# Patient Record
Sex: Female | Born: 1995 | Race: White | Hispanic: No | Marital: Single | Smoking: Never smoker
Health system: Southern US, Community
[De-identification: ages and names within clinical notes are randomized; demographics above are authoritative.]

## PROBLEM LIST (undated history)

## (undated) ENCOUNTER — Ambulatory Visit: Discharge status: Absent without leave | Payer: Self-pay

## (undated) DIAGNOSIS — K219 Gastro-esophageal reflux disease without esophagitis: Secondary | ICD-10-CM

## (undated) HISTORY — PX: MENISCUS REPAIR: SHX5179

---

## 2015-08-22 ENCOUNTER — Other Ambulatory Visit: Payer: Self-pay | Admitting: Family Medicine

## 2015-08-22 DIAGNOSIS — S79921S Unspecified injury of right thigh, sequela: Secondary | ICD-10-CM

## 2015-08-26 ENCOUNTER — Ambulatory Visit: Payer: Self-pay

## 2015-08-28 ENCOUNTER — Ambulatory Visit
Admission: RE | Admit: 2015-08-28 | Discharge: 2015-08-28 | Disposition: A | Discharge status: Home / Self Care | Payer: 59 | Source: Ambulatory Visit | Attending: Family Medicine | Admitting: Family Medicine

## 2015-08-28 DIAGNOSIS — S79921A Unspecified injury of right thigh, initial encounter: Secondary | ICD-10-CM | POA: Diagnosis present

## 2015-08-28 DIAGNOSIS — S79921S Unspecified injury of right thigh, sequela: Secondary | ICD-10-CM

## 2015-08-28 DIAGNOSIS — R531 Weakness: Secondary | ICD-10-CM | POA: Insufficient documentation

## 2015-08-28 DIAGNOSIS — M79652 Pain in left thigh: Secondary | ICD-10-CM | POA: Diagnosis not present

## 2015-11-17 DIAGNOSIS — J069 Acute upper respiratory infection, unspecified: Secondary | ICD-10-CM | POA: Diagnosis not present

## 2015-12-23 ENCOUNTER — Ambulatory Visit
Admission: RE | Admit: 2015-12-23 | Discharge: 2015-12-23 | Disposition: A | Discharge status: Home / Self Care | Payer: 59 | Source: Ambulatory Visit | Attending: Family Medicine | Admitting: Family Medicine

## 2015-12-23 ENCOUNTER — Encounter: Payer: Self-pay | Admitting: Family Medicine

## 2015-12-23 ENCOUNTER — Other Ambulatory Visit: Payer: Self-pay | Admitting: Family Medicine

## 2015-12-23 ENCOUNTER — Ambulatory Visit (INDEPENDENT_AMBULATORY_CARE_PROVIDER_SITE_OTHER): Payer: 59 | Admitting: Family Medicine

## 2015-12-23 DIAGNOSIS — M25462 Effusion, left knee: Secondary | ICD-10-CM | POA: Diagnosis not present

## 2015-12-23 DIAGNOSIS — S8992XA Unspecified injury of left lower leg, initial encounter: Secondary | ICD-10-CM

## 2015-12-23 DIAGNOSIS — M25562 Pain in left knee: Secondary | ICD-10-CM | POA: Diagnosis present

## 2015-12-23 DIAGNOSIS — R52 Pain, unspecified: Secondary | ICD-10-CM

## 2015-12-23 MED ORDER — NAPROXEN 500 MG PO TABS
500.0000 mg | ORAL_TABLET | Freq: Two times a day (BID) | ORAL | Status: DC
Start: 2015-12-23 — End: 2018-06-16

## 2015-12-23 NOTE — Progress Notes (Addendum)
Patient ID: Mariah Trevino, female   DOB: 08/21/1996, 20 y.o.   MRN: [id number]  Patient presents today with symptoms of left knee pain. Patient states that she twisted her knee yesterday out in the rain playing soccer. She denies any contact to the knee. She admits to some swelling of the knee and medial knee pain. She does have pain with extension of the knee and flexion. She does have a history of anterior cruciate ligament reconstruction of the left knee and also meniscal repair of that knee.  ROS: Negative except mentioned above.  Vitals as per Epic.  GENERAL: NAD RESP: CTA B CARD: RRR MSK: L Knee- mild effusion, unable to fully extend without discomfort, medial jointline tenderness, -Lachman, -Drawer, +McMurray, no significant laxity with valgus or varus stress, nv intact  NEURO: CN II-XII grossly intact   A/P: L Knee Injury- will send patient for x-rays, will order MRI to evaluate for any meniscal or ligamentous injury. Patient is to do GameReady and NSAIDs. Can stay in knee brace. Will discuss further plan of care once imaging has been done.

## 2015-12-23 NOTE — Addendum Note (Signed)
Addended by: Dione HousekeeperPATEL, Brahim Dolman N on: 12/23/2015 12:08 PM   Modules accepted: Orders

## 2015-12-25 ENCOUNTER — Ambulatory Visit
Admission: RE | Admit: 2015-12-25 | Discharge: 2015-12-25 | Disposition: A | Discharge status: Home / Self Care | Payer: 59 | Source: Ambulatory Visit | Attending: Family Medicine | Admitting: Family Medicine

## 2015-12-25 DIAGNOSIS — M25462 Effusion, left knee: Secondary | ICD-10-CM | POA: Diagnosis not present

## 2015-12-25 DIAGNOSIS — Z9889 Other specified postprocedural states: Secondary | ICD-10-CM | POA: Insufficient documentation

## 2015-12-25 DIAGNOSIS — S83212A Bucket-handle tear of medial meniscus, current injury, left knee, initial encounter: Secondary | ICD-10-CM | POA: Diagnosis not present

## 2015-12-25 DIAGNOSIS — S8992XA Unspecified injury of left lower leg, initial encounter: Secondary | ICD-10-CM

## 2015-12-25 DIAGNOSIS — S83282A Other tear of lateral meniscus, current injury, left knee, initial encounter: Secondary | ICD-10-CM | POA: Diagnosis not present

## 2016-02-13 ENCOUNTER — Ambulatory Visit (INDEPENDENT_AMBULATORY_CARE_PROVIDER_SITE_OTHER): Payer: 59 | Admitting: Family Medicine

## 2016-02-13 ENCOUNTER — Encounter: Payer: Self-pay | Admitting: Family Medicine

## 2016-02-13 VITALS — BP 135/66 | HR 69 | Temp 98.3°F | Resp 16

## 2016-02-13 DIAGNOSIS — H109 Unspecified conjunctivitis: Secondary | ICD-10-CM | POA: Diagnosis not present

## 2016-02-13 MED ORDER — POLYMYXIN B-TRIMETHOPRIM 10000-0.1 UNIT/ML-% OP SOLN: 1.0000 [drp] | Freq: Four times a day (QID) | OPHTHALMIC | Status: AC

## 2016-02-13 NOTE — Progress Notes (Signed)
Patient ID: Mariah Trevino, female   DOB: 10/02/1996, 20 y.o.   MRN: [id number] Patient presents today with symptoms of mattiness and redness of the left eye. Patient states that she noticed this earlier this morning. She denies any URI symptoms she denies wearing contact lenses or eye makeup. She denies fever. She denies any vision difficulty.  ROS: Negative except mentioned above.  GENERAL: NAD HEENT: no pharyngeal erythema, no exudate, no erythema of TMs, mild erythema of the left eye, no discharge noted, PERRL, EOMI, no cervical LAD RESP: CTA B CARD: RRR NEURO: CN II-XII grossly intact   A/P: Left Conjunctivitis- will treat with Polytrim ophthalmic, oral antihistamine when necessary, wash hands frequently, no weight room activities for the next 2 days at least, if symptoms do persist or worsen I do recommend that she follow up.

## 2016-07-11 HISTORY — PX: ANTERIOR CRUCIATE LIGAMENT REPAIR: SHX115

## 2016-09-20 ENCOUNTER — Ambulatory Visit (INDEPENDENT_AMBULATORY_CARE_PROVIDER_SITE_OTHER): Payer: 59 | Admitting: Family Medicine

## 2016-09-20 ENCOUNTER — Encounter: Payer: Self-pay | Admitting: Family Medicine

## 2016-09-20 VITALS — BP 130/77 | HR 89 | Temp 97.9°F | Resp 16

## 2016-09-20 DIAGNOSIS — J209 Acute bronchitis, unspecified: Secondary | ICD-10-CM

## 2016-09-20 MED ORDER — ALBUTEROL SULFATE HFA 108 (90 BASE) MCG/ACT IN AERS: 2.0000 | INHALATION_SPRAY | Freq: Four times a day (QID) | RESPIRATORY_TRACT | 2 refills | Status: DC | PRN

## 2016-09-20 MED ORDER — AZITHROMYCIN 250 MG PO TABS: ORAL_TABLET | ORAL | 0 refills | Status: DC

## 2016-09-20 NOTE — Progress Notes (Signed)
Patient presents today with symptoms of postnasal drip, mild productive cough. Patient has had symptoms for the last 5 days. She denies any fever or chills. She does become short of breath with the coughing at times. Mucus has been colored at times. She denies any calf pain or swelling. She has been taking Tylenol for her symptoms. She denies any history of asthma.  ROS: Negative except mentioned above. Vitals as per Epic.  GENERAL: NAD HEENT: no pharyngeal erythema, no exudate, no erythema of TMs, no cervical LAD RESP: Slightly decreased breath sounds at the bases, no wheezing appreciated no accessory muscle use noted CARD: RRR NEURO: CN II-XII grossly intact   A/P: Bronchitis -will treat with Z-Pak, Claritin when necessary, Delsym when necessary, Albuterol Inhaler with spacer when necessary, rest, hydration, no athletic activity if febrile or unable to due to respiratory symptoms. We'll seek medical attention if symptoms persist or worsen as discussed.

## 2017-05-30 ENCOUNTER — Ambulatory Visit (INDEPENDENT_AMBULATORY_CARE_PROVIDER_SITE_OTHER): Payer: PRIVATE HEALTH INSURANCE | Admitting: Family Medicine

## 2017-05-30 DIAGNOSIS — S8012XA Contusion of left lower leg, initial encounter: Secondary | ICD-10-CM

## 2017-05-31 NOTE — Progress Notes (Signed)
Patient presents today with hematoma to left lower leg area. Patient states that the area got hit during a soccer over a week ago. She has been applying ice to the area. She is able to walk and run without any pain. She states that it only hurts to touch. The swelling has decreased some since the injury. There is some decreased sensation over the hematoma but not distally. She denies any other injury.  ROS: Negative except mentioned above. Vitals as per Epic. GENERAL: NAD MSK: Left lower extremity - approx. 4 in x 3 in hematoma anterior shin area, mild to moderate tenderness, FROM of LLE, nv intact distally  NEURO: CN II-XII grossly intact   A/P: LLE Hematoma - ice, elevation, trainer to gently massage the area towards the heart, discussed with patient that it will take time to resorb, protect the area as much as possible to prevent recurrent injury, patient could be left with a small bump/hematoma. Seek medical attention if symptoms worsen as discussed.

## 2017-08-16 ENCOUNTER — Other Ambulatory Visit: Payer: Self-pay | Admitting: Family Medicine

## 2017-08-16 ENCOUNTER — Ambulatory Visit (INDEPENDENT_AMBULATORY_CARE_PROVIDER_SITE_OTHER): Payer: 59 | Admitting: Family Medicine

## 2017-08-16 ENCOUNTER — Ambulatory Visit
Admission: RE | Admit: 2017-08-16 | Discharge: 2017-08-16 | Disposition: A | Discharge status: Home / Self Care | Payer: PRIVATE HEALTH INSURANCE | Source: Ambulatory Visit | Attending: Family Medicine | Admitting: Family Medicine

## 2017-08-16 ENCOUNTER — Encounter: Payer: Self-pay | Admitting: Family Medicine

## 2017-08-16 VITALS — BP 124/71 | HR 83 | Temp 98.6°F | Resp 14

## 2017-08-16 DIAGNOSIS — G4459 Other complicated headache syndrome: Secondary | ICD-10-CM | POA: Diagnosis not present

## 2017-08-16 DIAGNOSIS — G935 Compression of brain: Secondary | ICD-10-CM | POA: Insufficient documentation

## 2017-08-16 DIAGNOSIS — G4489 Other headache syndrome: Secondary | ICD-10-CM

## 2017-08-17 LAB — COMPREHENSIVE METABOLIC PANEL WITH GFR
ALT: 17 IU/L (ref 0–32)
AST: 18 IU/L (ref 0–40)
Albumin/Globulin Ratio: 1.7 (ref 1.2–2.2)
Albumin: 4.6 g/dL (ref 3.5–5.5)
Alkaline Phosphatase: 102 IU/L (ref 39–117)
BUN/Creatinine Ratio: 16 (ref 9–23)
BUN: 13 mg/dL (ref 6–20)
Bilirubin Total: 0.5 mg/dL (ref 0.0–1.2)
CO2: 24 mmol/L (ref 20–29)
Calcium: 9.6 mg/dL (ref 8.7–10.2)
Chloride: 103 mmol/L (ref 96–106)
Creatinine, Ser: 0.81 mg/dL (ref 0.57–1.00)
GFR calc Af Amer: 120 mL/min/1.73 (ref >59)
GFR calc non Af Amer: 104 mL/min/1.73 (ref >59)
Globulin, Total: 2.7 g/dL (ref 1.5–4.5)
Glucose: 105 mg/dL — ABNORMAL HIGH (ref 65–99)
Potassium: 4.3 mmol/L (ref 3.5–5.2)
Sodium: 142 mmol/L (ref 134–144)
Total Protein: 7.3 g/dL (ref 6.0–8.5)

## 2017-08-17 LAB — CBC WITH DIFFERENTIAL/PLATELET
BASOS ABS: 0 10*3/uL (ref 0.0–0.2)
BASOS: 0 %
EOS (ABSOLUTE): 0 10*3/uL (ref 0.0–0.4)
Eos: 0 %
Hematocrit: 41.6 % (ref 34.0–46.6)
Hemoglobin: 14 g/dL (ref 11.1–15.9)
IMMATURE GRANS (ABS): 0 10*3/uL (ref 0.0–0.1)
IMMATURE GRANULOCYTES: 0 %
LYMPHS: 14 %
Lymphocytes Absolute: 1.2 10*3/uL (ref 0.7–3.1)
MCH: 29.3 pg (ref 26.6–33.0)
MCHC: 33.7 g/dL (ref 31.5–35.7)
MCV: 87 fL (ref 79–97)
MONOS ABS: 0.3 10*3/uL (ref 0.1–0.9)
Monocytes: 4 %
Neutrophils Absolute: 7.1 10*3/uL — ABNORMAL HIGH (ref 1.4–7.0)
Neutrophils: 82 %
PLATELETS: 286 10*3/uL (ref 150–379)
RBC: 4.78 x10E6/uL (ref 3.77–5.28)
RDW: 12.4 % (ref 12.3–15.4)
WBC: 8.6 10*3/uL (ref 3.4–10.8)

## 2017-08-17 LAB — TSH: TSH: 1.11 u[IU]/mL (ref 0.450–4.500)

## 2017-08-17 LAB — VITAMIN D 25 HYDROXY (VIT D DEFICIENCY, FRACTURES): Vit D, 25-Hydroxy: 47.9 ng/mL (ref 30.0–100.0)

## 2017-08-18 ENCOUNTER — Ambulatory Visit (INDEPENDENT_AMBULATORY_CARE_PROVIDER_SITE_OTHER): Payer: 59 | Admitting: Family Medicine

## 2017-08-18 ENCOUNTER — Encounter: Payer: Self-pay | Admitting: Family Medicine

## 2017-08-18 VITALS — BP 124/76 | HR 56 | Temp 98.4°F | Resp 14

## 2017-08-18 DIAGNOSIS — G4459 Other complicated headache syndrome: Secondary | ICD-10-CM

## 2017-08-18 NOTE — Progress Notes (Signed)
Patient presents today for follow-up regarding recent CT scan. 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 Discussed the process of this condition. She has had no previous CTs or MRIs in the past. Patient has had no changes in her symptoms and she saw me a few days ago. I have spoken to Dr. Cristopher PeruHemang Shah who will be seeing the patient tomorrow. I've asked the patient to write down the course of events and her symptoms to share with Dr. Sherryll BurgerShah. Told the patient I would be happy to speak with her parents or Dr. Sherryll BurgerShah can also. Woodward KuWeill continue at this point to hold out of athletic activity. She has no questions at this time. If any worsening symptoms she is to seek medical attention immediately.

## 2017-08-18 NOTE — Progress Notes (Signed)
Patient presents today with episodes of waking up in the middle the night and morning headaches. Patient states that she has had the symptoms for the last 2-3 weeks. The nighttime awakenings happen 2-3 times a week and so did the morning headaches. She she states that after she wakes up in the middle the night it takes her anywhere from 10 minutes to an hour to fall back asleep. She denies any history of generalized anxiety or panic attacks. Patient had a concussion after a blow to the back of the head playing soccer in September. She returned back to soccer after being asymptomatic and following the concussion protocol. She denies any recent reinjury to her head. She states that 30 minutes after practice today she had a few minutes of blurry vision. She states that she has never had symptoms like that before. She has minimal left neck discomfort which she has had for a few weeks. She admits to only minimal dizziness at times. She denies having any balance problems. She denies any syncopal episodes. She has intermittent headaches throughout the day. She rates the pain level of these headaches a 2 out of 10. She has taken Tylenol/Ibuprofen on occasion when she does have the headaches. She denies any past history of migraines. She denies any new medications or supplements. She denies any chronic medical problems such as sleep apnea or diabetes. She admits that her menstrual cycles are regular.  ROS: Negative except mentioned above. Vitals as per Epic. GENERAL: NAD HEENT: no pharyngeal erythema, no exudate, no erythema of TMs, no cervical LAD, PERRL, EOMI RESP: CTA B CARD: RRR MSK: No midline tenderness of cervical spine, mild paravertebral tenderness on the left compared to right, slightly limited range of motion to the left compared to the right, negative Spurling's NEURO: CN II-XII grossly intact, negative Rombergs, no nystagmus appreciated  A/P: Nighttime awakenings, headaches -given patient's symptoms  will order CT head, patient may need sleep study given her symptoms, I will hold her from athletic activity for now, may need to refer patient to Neurology. Discussed plan of care with patient and trainer. She is to seek immediate medical attention if any of her symptoms worsen.

## 2017-08-23 ENCOUNTER — Encounter: Payer: Self-pay | Admitting: Family Medicine

## 2017-08-23 ENCOUNTER — Ambulatory Visit (INDEPENDENT_AMBULATORY_CARE_PROVIDER_SITE_OTHER): Payer: 59 | Admitting: Family Medicine

## 2017-08-23 VITALS — BP 125/67 | HR 81

## 2017-08-23 DIAGNOSIS — R51 Headache: Secondary | ICD-10-CM

## 2017-08-23 DIAGNOSIS — R519 Headache, unspecified: Secondary | ICD-10-CM

## 2017-08-23 NOTE — Addendum Note (Signed)
Addended by: Dione HousekeeperPATEL, Cherissa Hook N on: 08/23/2017 11:59 AM   Modules accepted: Orders

## 2017-08-23 NOTE — Progress Notes (Signed)
Here for B12 level requested by Dr. Cristopher PeruHemang Shah.

## 2017-08-24 LAB — B12 AND FOLATE PANEL
FOLATE: 17.6 ng/mL (ref >3.0)
Vitamin B-12: 390 pg/mL (ref 232–1245)

## 2017-08-26 ENCOUNTER — Other Ambulatory Visit: Payer: Self-pay | Admitting: Neurology

## 2017-08-26 DIAGNOSIS — Q048 Other specified congenital malformations of brain: Secondary | ICD-10-CM

## 2017-09-02 ENCOUNTER — Ambulatory Visit: Payer: 59

## 2017-09-06 ENCOUNTER — Ambulatory Visit
Admission: RE | Admit: 2017-09-06 | Discharge: 2017-09-06 | Disposition: A | Discharge status: Home / Self Care | Payer: PRIVATE HEALTH INSURANCE | Source: Ambulatory Visit | Attending: Neurology | Admitting: Neurology

## 2017-09-06 DIAGNOSIS — Q048 Other specified congenital malformations of brain: Secondary | ICD-10-CM | POA: Insufficient documentation

## 2017-09-06 DIAGNOSIS — S060X0D Concussion without loss of consciousness, subsequent encounter: Secondary | ICD-10-CM | POA: Diagnosis present

## 2017-09-06 DIAGNOSIS — X58XXXD Exposure to other specified factors, subsequent encounter: Secondary | ICD-10-CM | POA: Insufficient documentation

## 2017-09-06 MED ORDER — GADOBENATE DIMEGLUMINE 529 MG/ML IV SOLN
15.0000 mL | Freq: Once | INTRAVENOUS | Status: AC | PRN
  Administered 2017-09-06: 14 mL via INTRAVENOUS

## 2018-04-21 IMAGING — MR MR HEAD WO/W CM
10 series · 48 of 48 positions shown · IV contrast (multihance)
Comparison: Head CT 08/16/2017

CLINICAL DATA: Concussion for 2 months. Frontotemporal headache
with dizziness.

"Cerebral cortical dysgenesis"
EXAM:
MRI HEAD WITHOUT AND WITH CONTRAST
TECHNIQUE: Multiplanar, multiecho pulse sequences of the brain and surrounding
structures were obtained without and with intravenous contrast.
CONTRAST:  14mL MULTIHANCE GADOBENATE DIMEGLUMINE 529 MG/ML IV SOLN

[Series 2: T1 · sagittal · 5.0mm · 0.45mm/px · 2 of 27 slices shown (1 of 2)]
[im 1/27]
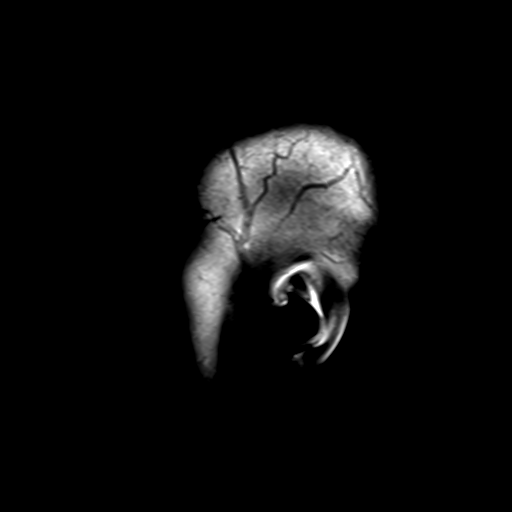
[im 27/27]
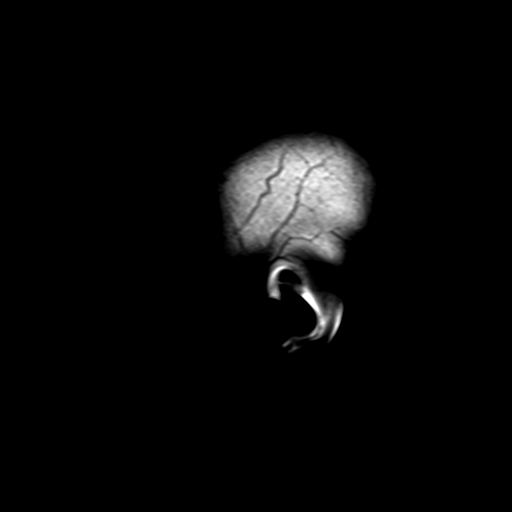

[Series 4: DWI · axial · 3.0mm · 1.80mm/px · z∈[-67,+94]mm · 4 of 55 slices shown]
[im 1/55]
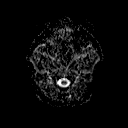
[im 19/55]
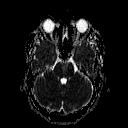
[im 37/55]
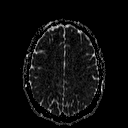
[im 55/55]
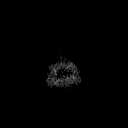

[Series 5: T2 · axial · 5.0mm · 0.60mm/px · z∈[-74,+94]mm · 2 of 27 slices shown (1 of 2)]
[im 1/27]
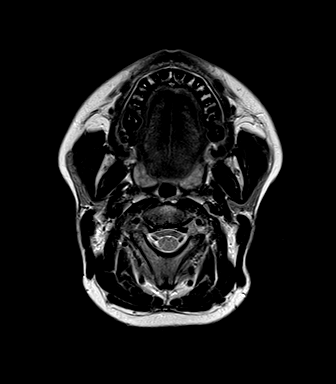
[im 27/27]
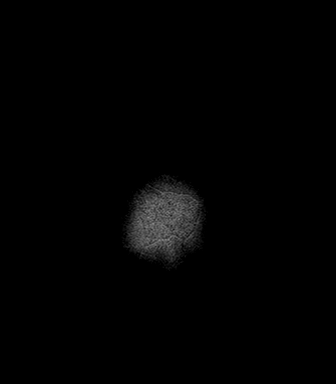

[Series 6: FLAIR · axial · 3.0mm · 0.45mm/px · z∈[-68,+88]mm · 4 of 53 slices shown]
[im 1/53]
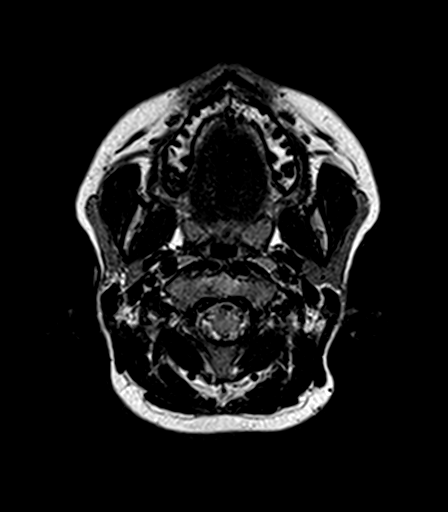
[im 18/53]
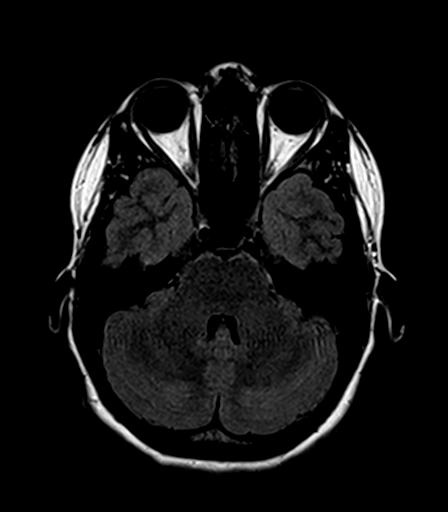
[im 35/53]
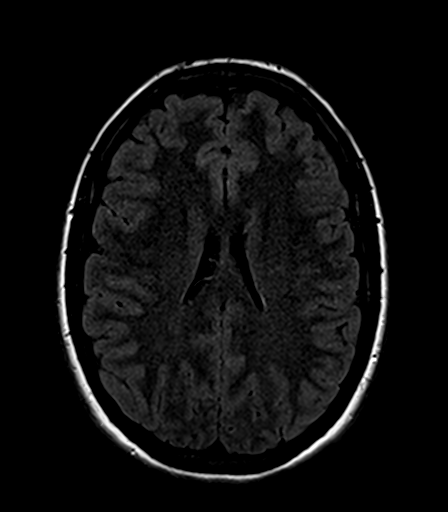
[im 53/53]
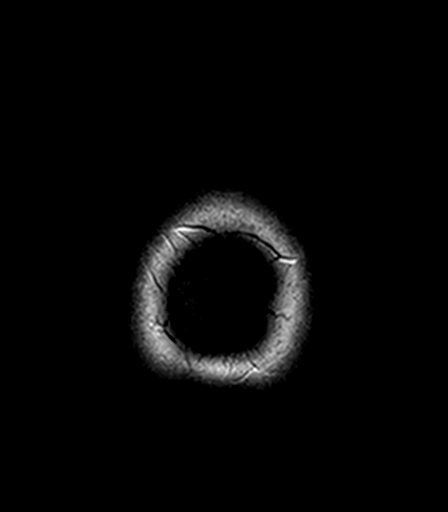

[Series 7: T2 · axial · 5.0mm · 0.45mm/px · z∈[-74,+94]mm · 2 of 27 slices shown (2 of 2)]
[im 1/27]
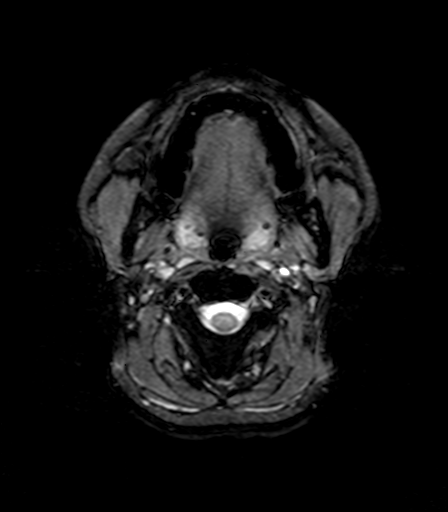
[im 27/27]
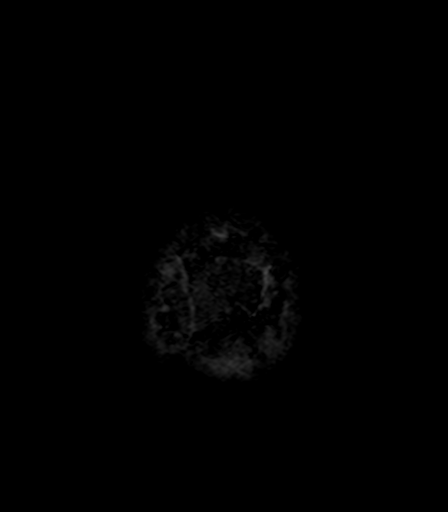

[Series 8: T1 · axial · 1.0mm · 1.00mm/px · z∈[-76,+99]mm · 13 of 176 slices shown (2 of 2)]
[im 1/176]
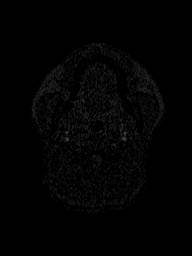
[im 15/176]
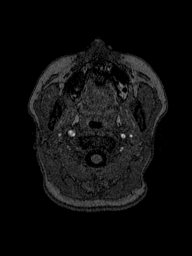
[im 30/176]
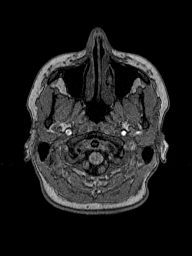
[im 44/176]
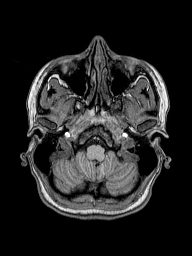
[im 59/176]
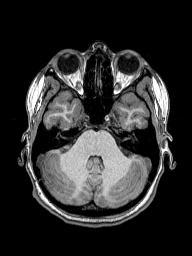
[im 73/176]
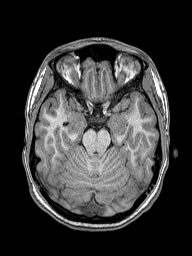
[im 88/176]
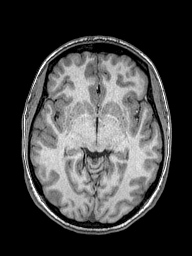
[im 103/176]
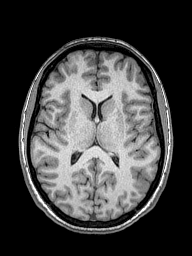
[im 117/176]
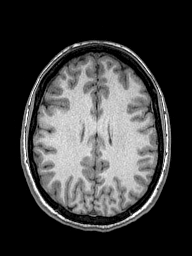
[im 132/176]
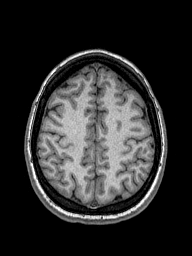
[im 146/176]
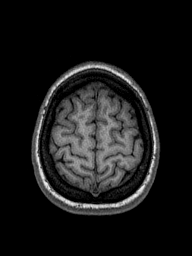
[im 161/176]
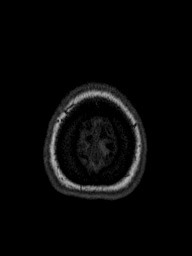
[im 176/176]
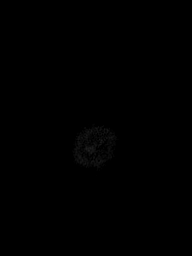

[Series 9: T2 post-contrast · coronal · 5.0mm · 0.49mm/px · 2 of 31 slices shown]
[im 1/31]
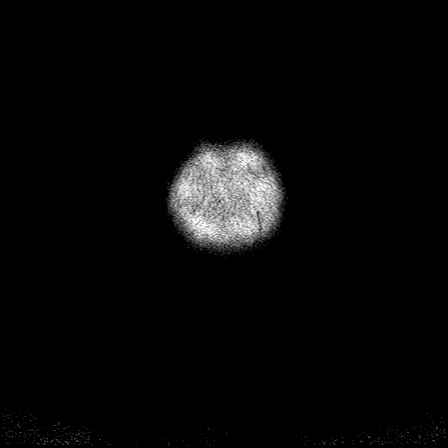
[im 31/31]
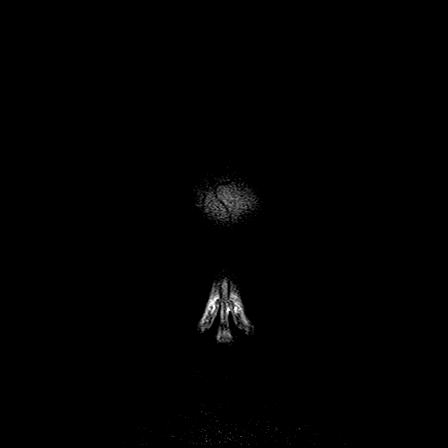

[Series 10: T1 post-contrast · axial · 1.0mm · 1.00mm/px · z∈[-76,+99]mm · 13 of 176 slices shown (1 of 2)]
[im 1/176]
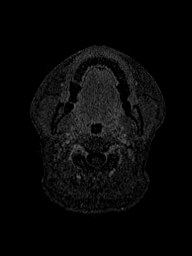
[im 15/176]
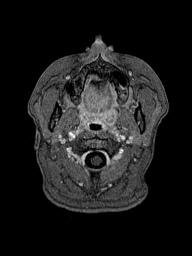
[im 30/176]
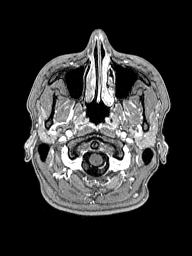
[im 44/176]
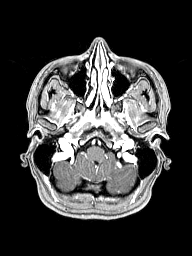
[im 59/176]
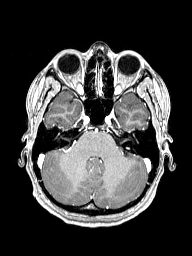
[im 73/176]
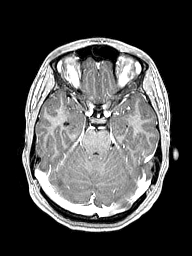
[im 88/176]
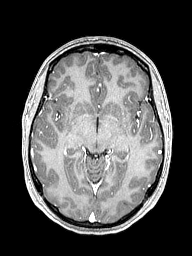
[im 103/176]
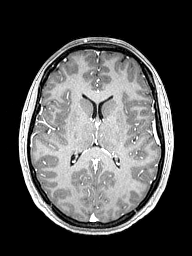
[im 117/176]
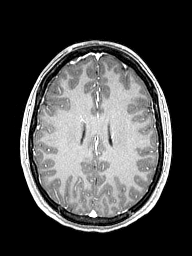
[im 132/176]
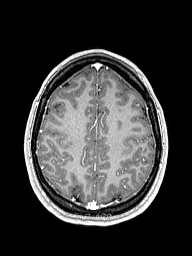
[im 146/176]
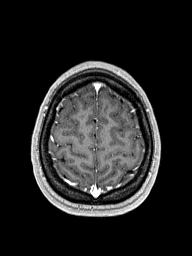
[im 161/176]
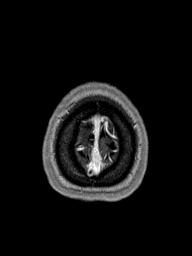
[im 176/176]
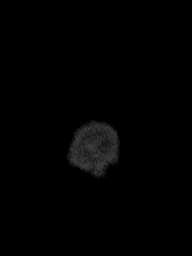

[Series 11: T1 post-contrast · coronal · 5.0mm · 0.43mm/px · 2 of 31 slices shown (2 of 2)]
[im 1/31]
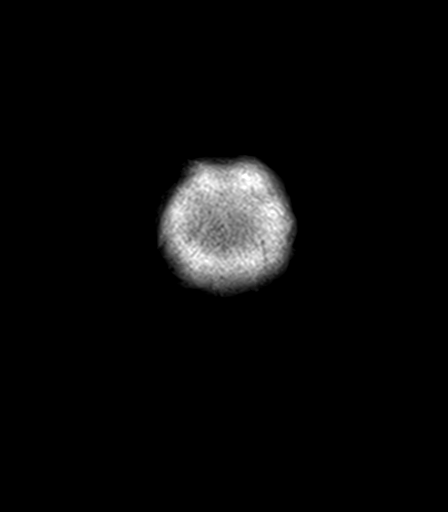
[im 31/31]
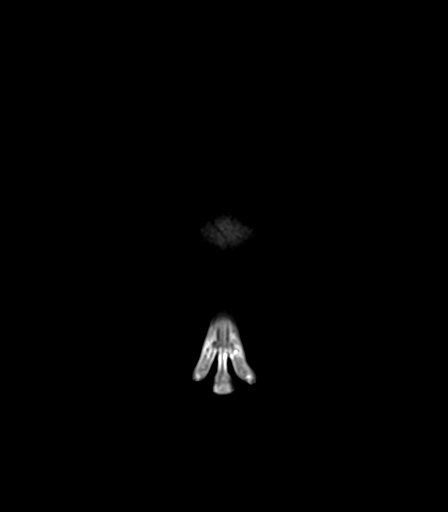

[Series 100: ax (id) · axial · 3.0mm · 1.80mm/px · z∈[-67,+94]mm · 4 of 53 slices shown]
[im 1/53]
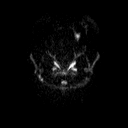
[im 18/53]
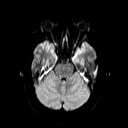
[im 35/53]
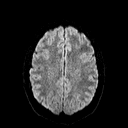
[im 53/53]
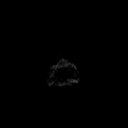

[48 of 48 positions shown; findings below may reference images not displayed]

FINDINGS: Brain: No posttraumatic gliosis, atrophy, blood products, or edema.
Brain morphology and white matter signal is normal. No infarct,
mass, or hydrocephalus. The cerebellar tonsils project 6 mm below
the foramen magnum at most.

Vascular: Major flow voids and vascular enhancements are preserved.

Skull and upper cervical spine: Negative.

Sinuses/Orbits: Small retention cysts in the right maxillary and
right sphenoid sinuses.
IMPRESSION: 1. No posttraumatic finding.
2. Cerebellar tonsils project 6 mm below the foramen magnum, but no
foramen magnum stenosis or upper cord syrinx to suggest Chiari
malformation.

## 2018-04-27 ENCOUNTER — Ambulatory Visit (INDEPENDENT_AMBULATORY_CARE_PROVIDER_SITE_OTHER): Payer: PRIVATE HEALTH INSURANCE | Admitting: Family Medicine

## 2018-04-27 ENCOUNTER — Encounter: Payer: Self-pay | Admitting: Family Medicine

## 2018-04-27 VITALS — Temp 98.6°F | Resp 14

## 2018-04-27 DIAGNOSIS — G8929 Other chronic pain: Secondary | ICD-10-CM

## 2018-04-27 DIAGNOSIS — R51 Headache: Secondary | ICD-10-CM

## 2018-04-27 NOTE — Progress Notes (Signed)
Patient presents today for medication refill. Patient states that she has been in [place] and is soccer camp over the summer. She has been without her Topamax for about a month. She states that the medication has been helping her since prescribed by Dr. Sherryll BurgerShah, the neurologist. She admits that she would get 1-2 brief headaches a week while she was taking the medication regularly. Now that she has been without the medication for a month she has been getting headaches daily. They usually occur in the morning. They last for a few minutes to a few hours. She denies playing soccer over the summer while she has a headache. She has been taking Tylenol or Ibuprofen for them which has helped.she denies any recent trauma or injury to the head decides playing soccer. She denies any other concussive symptoms. She does not develop nausea or vomiting or vision changes with her headaches. They happen in the occipital or frontal region. She denies any sleep or appetite changes. She admits to taking the Magnesium also that was prescribed by Dr. Sherryll BurgerShah.  ROS: Negative except mentioned above. Vitals as per Epic. GENERAL: NAD HEENT: no pharyngeal erythema, no exudate, no erythema of TMs, PERRL, EOMI RESP: CTA B CARD: RRR NEURO: CN II-XII grossly intact, -Rombergs  A/P: Headaches - will restart patient's Topamax, she will take 25 mg in the evening for 1 week and then will restart the 50 mg the following week, I have encouraged her to stay hydrated with athletic activity and monitor for any persistent or worsening symptoms once she restarts the Topamax. I would recommend that she follow-up with Dr. Sherryll BurgerShah within the next few weeks. Patient addresses understanding of this and will make the follow-up appointment. Seek medical attention if any acute problems.

## 2018-05-22 ENCOUNTER — Ambulatory Visit (INDEPENDENT_AMBULATORY_CARE_PROVIDER_SITE_OTHER): Payer: PRIVATE HEALTH INSURANCE | Admitting: Family Medicine

## 2018-05-22 VITALS — BP 117/68 | HR 57 | Temp 98.9°F | Resp 14

## 2018-05-22 DIAGNOSIS — R2689 Other abnormalities of gait and mobility: Secondary | ICD-10-CM

## 2018-05-22 NOTE — Progress Notes (Signed)
Patient presents today regarding an episode that occurred after practice on Saturday evening. Patient states that she had issues with her balance after the practice. She admits to having minimal headache and no other symptoms. The athletic trainer and team could see that there was something wrong with her balance. She had times had to hold onto a teammate. She admits to eating prior to practice and hydrating before and throughout practice. Patient went home and went to bed after practice. She woke up the next day feeling fine. She admits to feeling fine today. She states that when she woke up Saturday evening she already didn't feel well. She admits that she only got approximately 4 hours of sleep. She denies a lack of sleep being due to a headache. She denies any alcohol or illicit drug use on Friday evening. She describes having a 5 out of 10 headache when she woke upon Saturday. She also had some fatigue and dizziness. She admits that given her symptoms she should have not practiced that evening. She knows that Dr. Sherryll BurgerShah (Neurologist)  told her to sit out of activity if she did not feel well prior to practices/games. She is aware that she did not follow this recommendation on Saturday. She denies any URI symptoms, fever, nausea, vomiting. She has been taking her Topamax in the evening. She has noticed some decrease in her headache since starting the Topamax again. She was out of the Topamax for several weeks over the summer. She has been using anywhere between 200 mg to 800 mg of Ibuprofen daily for headache. She states that on average her headaches can be anywhere from a 3-8 out of 10. She denies any other new symptoms.  ROS: Negative except mentioned above. Vitals: as per Epic GENERAL: NAD HEENT: no pharyngeal erythema, no exudate, no erythema of TMs RESP: CTA B CARD: RRR NEURO: CN II-XII grossly intact, negative Romberg's, normal heel-to-toe walk  A/P: History of concussions, Chronic Headaches, Recent  Episode Related To Balance - unsure whether episode with balance is related to her chronic issues or vertigo, lack of sleep, etc.  No other new concussive symptoms at this time, symptoms of balance resolved by the next morning, given her history of concussions and chronic headaches would recommend that patient follow up with Dr. Sherryll BurgerShah, for now patient is only allowed to do aerobic noncontact activity under the supervision of the athletic trainer, if any symptoms develop patient is to stop activity, further recommendations regarding athletic activity will be discussed after seeing Dr. Sherryll BurgerShah. Patient addresses understanding. Patient states her desire to continue playing soccer. We will have to discuss further once she has seen Dr. Sherryll BurgerShah.

## 2018-06-16 ENCOUNTER — Encounter: Payer: Self-pay | Admitting: Family Medicine

## 2018-06-16 ENCOUNTER — Ambulatory Visit (INDEPENDENT_AMBULATORY_CARE_PROVIDER_SITE_OTHER): Payer: PRIVATE HEALTH INSURANCE | Admitting: Family Medicine

## 2018-06-16 ENCOUNTER — Other Ambulatory Visit: Payer: Self-pay | Admitting: Family Medicine

## 2018-06-16 DIAGNOSIS — K0889 Other specified disorders of teeth and supporting structures: Secondary | ICD-10-CM

## 2018-06-16 MED ORDER — NAPROXEN 500 MG PO TABS: 500.0000 mg | ORAL_TABLET | Freq: Two times a day (BID) | ORAL | 0 refills | Status: DC

## 2018-06-16 MED ORDER — AMOXICILLIN-POT CLAVULANATE 875-125 MG PO TABS: 1.0000 | ORAL_TABLET | Freq: Two times a day (BID) | ORAL | 0 refills | Status: DC

## 2018-06-16 NOTE — Progress Notes (Signed)
Patient presents today with symptoms of left molar pain and left jaw swelling. Patient states that she's had the symptoms for the last few days. Patient denies any fever. She has been taking intermittent NSAIDs. She admits to seeing a dentist in December 2018 and not having any issues.  ROS: Negative except mentioned above.  GENERAL: NAD HEENT: no pharyngeal erythema, no exudate, mild tenderness of back left molar, no abscess appreciated, mild soft tissue swelling, no erythema of TMs RESP: CTA B CARD: RRR NEURO: CN II-XII grossly intact    A/P: Dental Pain - will start patient on Augmentin, Naprosyn, encouraged soft foods and hydration, recommend patient monitor for any worsening symptoms including fever, no athletic activity if febrile or worsening symptoms, will need to see a dentist/oral surgeon if symptoms persist/worsen, discussed plan with trainer.

## 2018-12-04 ENCOUNTER — Encounter: Payer: Self-pay | Admitting: Family Medicine

## 2018-12-04 ENCOUNTER — Ambulatory Visit (INDEPENDENT_AMBULATORY_CARE_PROVIDER_SITE_OTHER): Payer: PRIVATE HEALTH INSURANCE | Admitting: Family Medicine

## 2018-12-04 VITALS — BP 141/73 | HR 69 | Temp 98.7°F | Resp 14

## 2018-12-04 DIAGNOSIS — M542 Cervicalgia: Secondary | ICD-10-CM | POA: Diagnosis not present

## 2018-12-04 DIAGNOSIS — R519 Headache, unspecified: Secondary | ICD-10-CM

## 2018-12-04 DIAGNOSIS — R51 Headache: Secondary | ICD-10-CM

## 2018-12-11 ENCOUNTER — Other Ambulatory Visit: Payer: Self-pay | Admitting: Neurology

## 2018-12-11 DIAGNOSIS — G932 Benign intracranial hypertension: Secondary | ICD-10-CM

## 2018-12-11 NOTE — Progress Notes (Signed)
Patient presents with symptoms of neck pain. Patient states that last week she woke up and it was difficult to move her head in any direction without pain. She denies any trauma or injury to the area. She admits her ROM and pain have improved since that time. She however does have some pain along the upper neck area to palpation. She states that when she presses the area she gets pain that shoots up her occiput. She also has noticed headaches developing after doing certain things in the weightroom. She denies any vision problems, balance problems, or dizziness.  Patient does have a history of previous concussions and chronic headaches. She is followed by Neurology, Dr. Sherryll Burger. Currently she is only taking Nortriptyline at night for her headaches. She intermittently uses NSAIDS. She denies any other recent illnesses or symptoms.   ROS: Negative except mentioned above. Vitals as per Epic.  GENERAL: NAD HEENT: no pharyngeal erythema, no exudate, no erythema of TMs, no cervical LAD RESP: CTA B CARD: RRR MSK: mild tenderness along upper cervical area, no mass appreciated on palpation, slightly decreased flexion and right side rotation due to pain, -Spurlings, 5/5 strength of UEs, nv intact  NEURO: CN II-XII grossly intact, -Rombergs  A/P: Chronic Headaches - given patients new symptoms will have patient follow-up with Neurology, Neurology will decide if further work-up with imaging, etc needs to be done. No athletic activity for now. Discussed with AT. Patient addressed understanding.  Hx of Concussions - current symptoms do not appear to be related to head injury/trauma, no physical activity for now, patient still voices desire to continue to play soccer despite chronic headache symptoms, patient understands possible current and future health risks involved, patient to further discuss with Neurology.

## 2018-12-15 ENCOUNTER — Ambulatory Visit
Admission: RE | Admit: 2018-12-15 | Discharge: 2018-12-15 | Disposition: A | Discharge status: Home / Self Care | Payer: PRIVATE HEALTH INSURANCE | Source: Ambulatory Visit | Attending: Neurology | Admitting: Neurology

## 2018-12-15 DIAGNOSIS — G932 Benign intracranial hypertension: Secondary | ICD-10-CM | POA: Diagnosis present

## 2018-12-15 LAB — CBC WITH DIFFERENTIAL/PLATELET
Abs Immature Granulocytes: 0.01 10*3/uL (ref 0.00–0.07)
Basophils Absolute: 0 10*3/uL (ref 0.0–0.1)
Basophils Relative: 0 %
EOS ABS: 0 10*3/uL (ref 0.0–0.5)
Eosinophils Relative: 0 %
HCT: 41.9 % (ref 36.0–46.0)
Hemoglobin: 13.7 g/dL (ref 12.0–15.0)
Immature Granulocytes: 0 %
Lymphocytes Relative: 29 %
Lymphs Abs: 1.5 10*3/uL (ref 0.7–4.0)
MCH: 29.3 pg (ref 26.0–34.0)
MCHC: 32.7 g/dL (ref 30.0–36.0)
MCV: 89.7 fL (ref 80.0–100.0)
MONO ABS: 0.2 10*3/uL (ref 0.1–1.0)
Monocytes Relative: 5 %
Neutro Abs: 3.4 10*3/uL (ref 1.7–7.7)
Neutrophils Relative %: 66 %
Platelets: 261 10*3/uL (ref 150–400)
RBC: 4.67 MIL/uL (ref 3.87–5.11)
RDW: 11.7 % (ref 11.5–15.5)
WBC: 5.2 10*3/uL (ref 4.0–10.5)
nRBC: 0 % (ref 0.0–0.2)

## 2018-12-15 LAB — CSF CELL COUNT WITH DIFFERENTIAL
Eosinophils, CSF: 0 %
Lymphs, CSF: 0 %
Monocyte-Macrophage-Spinal Fluid: 0 %
RBC Count, CSF: 11 /mm3 — ABNORMAL HIGH (ref 0–3)
Segmented Neutrophils-CSF: 0 %
TUBE #: 3
WBC, CSF: 0 /mm3 (ref 0–5)

## 2018-12-15 LAB — PROTEIN, CSF: Total  Protein, CSF: 26 mg/dL (ref 15–45)

## 2018-12-15 LAB — GLUCOSE, RANDOM: Glucose, Bld: 94 mg/dL (ref 70–99)

## 2018-12-15 LAB — PROTIME-INR
INR: 1.1 (ref 0.8–1.2)
Prothrombin Time: 14.1 seconds (ref 11.4–15.2)

## 2018-12-15 LAB — GLUCOSE, CSF: Glucose, CSF: 56 mg/dL (ref 40–70)

## 2018-12-15 LAB — POCT PREGNANCY, URINE: Preg Test, Ur: NEGATIVE

## 2018-12-15 MED ORDER — ACETAMINOPHEN 500 MG PO TABS
1000.0000 mg | ORAL_TABLET | Freq: Four times a day (QID) | ORAL | Status: DC | PRN
  Administered 2018-12-15: 1000 mg via ORAL
  Filled 2018-12-15 (×2): qty 2

## 2018-12-15 NOTE — Discharge Instructions (Signed)
Lumbar Puncture, Care After  This sheet gives you information about how to care for yourself after your procedure. Your health care provider may also give you more specific instructions. If you have problems or questions, contact your health care provider.  What can I expect after the procedure?  After the procedure, it is common to have:   Mild discomfort or pain at the puncture site.   A mild headache that is relieved with pain medicines.  Follow these instructions at home:  Activity     Lie down flat or rest for as long as directed by your health care provider.   Return to your normal activities as told by your health care provider. Ask your health care provider what activities are safe for you.   Avoid lifting anything heavier than 10 lb (4.5 kg) for at least 12 hours after the procedure.   Do not drive for 24 hours if you were given a medicine to help you relax (sedative) during your procedure.   Do not drive or use heavy machinery while taking prescription pain medicine.  Puncture site care   Remove or change your bandage (dressing) as told by your health care provider.   Check your puncture area every day for signs of infection. Check for:  ? More pain.  ? Redness or swelling.  ? Fluid or blood leaking from the puncture site.  ? Warmth.  ? Pus or a bad smell.  General instructions   Take over-the-counter and prescription medicines only as told by your health care provider.   Drink enough fluids to keep your urine clear or pale yellow. Your health care provider may recommend drinking caffeine to prevent a headache.   Keep all follow-up visits as told by your health care provider. This is important.  Contact a health care provider if:   You have fever or chills.   You have nausea or vomiting.   You have a headache that lasts for more than 2 days or does not get better with medicine.  Get help right away if:   You develop any of the following in your  legs:  ? Weakness.  ? Numbness.  ? Tingling.   You are unable to control when you urinate or have a bowel movement (incontinence).   You have signs of infection around your puncture site, such as:  ? More pain.  ? Redness or swelling.  ? Fluid or blood leakage.  ? Warmth.  ? Pus or a bad smell.   You are dizzy or you feel like you might faint.   You have a severe headache, especially when you sit or stand.  Summary   A lumbar puncture is a procedure in which a small needle is inserted into the lower back to remove fluid that surrounds the brain and spinal cord.   After this procedure, it is common to have a headache and pain around the needle insertion area.   Lying flat, staying hydrated, and drinking caffeine can help prevent headaches.   Monitor your needle insertion site for signs of infection, including warmth, fluid, or more pain.   Get help right away if you develop leg weakness, leg numbness, incontinence, or severe headaches.  This information is not intended to replace advice given to you by your health care provider. Make sure you discuss any questions you have with your health care provider.  Document Released: 10/02/2013 Document Revised: 11/10/2016 Document Reviewed: 11/10/2016  Elsevier Interactive Patient Education  2019 [employer]

## 2018-12-15 NOTE — Progress Notes (Signed)
Tolerated lying flat since lumbar puncture. Drank coffee and water. Ate a fruit cup. VSS. Given tylenol for a mild headache with some relief. Dr. Allena Katz (radiologist) in to see patient and stated that the patient is ok for discharge at this time.

## 2018-12-19 LAB — OLIGOCLONAL BANDS, CSF + SERM

## 2018-12-20 LAB — HSV(HERPES SMPLX VRS)ABS-I+II(IGG)-CSF: HSV Type I/II Ab, IgG CSF: <0.34 IV (ref <=0.89)

## 2019-02-09 ENCOUNTER — Other Ambulatory Visit: Payer: Self-pay

## 2019-02-09 ENCOUNTER — Other Ambulatory Visit (INDEPENDENT_AMBULATORY_CARE_PROVIDER_SITE_OTHER): Payer: PRIVATE HEALTH INSURANCE | Admitting: Family Medicine

## 2019-02-09 DIAGNOSIS — R51 Headache: Secondary | ICD-10-CM | POA: Diagnosis not present

## 2019-02-09 DIAGNOSIS — R519 Headache, unspecified: Secondary | ICD-10-CM

## 2019-02-11 LAB — CBC WITH DIFFERENTIAL/PLATELET
Basophils Absolute: 0 10*3/uL (ref 0.0–0.2)
Basos: 0 %
EOS (ABSOLUTE): 0 10*3/uL (ref 0.0–0.4)
Eos: 1 %
Hematocrit: 40.1 % (ref 34.0–46.6)
Hemoglobin: 13.7 g/dL (ref 11.1–15.9)
Immature Grans (Abs): 0 10*3/uL (ref 0.0–0.1)
Immature Granulocytes: 0 %
Lymphocytes Absolute: 1.9 10*3/uL (ref 0.7–3.1)
Lymphs: 35 %
MCH: 30.3 pg (ref 26.6–33.0)
MCHC: 34.2 g/dL (ref 31.5–35.7)
MCV: 89 fL (ref 79–97)
Monocytes Absolute: 0.2 10*3/uL (ref 0.1–0.9)
Monocytes: 4 %
Neutrophils Absolute: 3.2 10*3/uL (ref 1.4–7.0)
Neutrophils: 60 %
Platelets: 285 10*3/uL (ref 150–450)
RBC: 4.52 x10E6/uL (ref 3.77–5.28)
RDW: 12.1 % (ref 11.7–15.4)
WBC: 5.3 10*3/uL (ref 3.4–10.8)

## 2019-02-11 LAB — COMPREHENSIVE METABOLIC PANEL
ALT: 9 IU/L (ref 0–32)
AST: 12 IU/L (ref 0–40)
Albumin/Globulin Ratio: 1.3 (ref 1.2–2.2)
Albumin: 4 g/dL (ref 3.9–5.0)
Alkaline Phosphatase: 70 IU/L (ref 39–117)
BUN/Creatinine Ratio: 13 (ref 9–23)
BUN: 11 mg/dL (ref 6–20)
Bilirubin Total: 0.2 mg/dL (ref 0.0–1.2)
CO2: 14 mmol/L — ABNORMAL LOW (ref 20–29)
Calcium: 8.7 mg/dL (ref 8.7–10.2)
Chloride: 109 mmol/L — ABNORMAL HIGH (ref 96–106)
Creatinine, Ser: 0.83 mg/dL (ref 0.57–1.00)
GFR calc Af Amer: 116 mL/min/{1.73_m2} (ref >59)
GFR calc non Af Amer: 100 mL/min/{1.73_m2} (ref >59)
Globulin, Total: 3 g/dL (ref 1.5–4.5)
Glucose: 145 mg/dL — ABNORMAL HIGH (ref 65–99)
Potassium: 3.4 mmol/L — ABNORMAL LOW (ref 3.5–5.2)
Sodium: 138 mmol/L (ref 134–144)
Total Protein: 7 g/dL (ref 6.0–8.5)

## 2019-06-05 ENCOUNTER — Other Ambulatory Visit: Payer: Self-pay | Admitting: Family Medicine

## 2019-06-05 DIAGNOSIS — Z1159 Encounter for screening for other viral diseases: Secondary | ICD-10-CM

## 2019-07-30 IMAGING — RF DG FLUORO GUIDE LUMBAR PUNCTURE
1 series · 1 of 1 positions shown · non-contrast
Comparison: none

CLINICAL DATA: Idiopathic intracranial hypertension

[Series 1: cp_standard · 0.27mm/px · 1 of 1 slices shown]
[im 1/1]
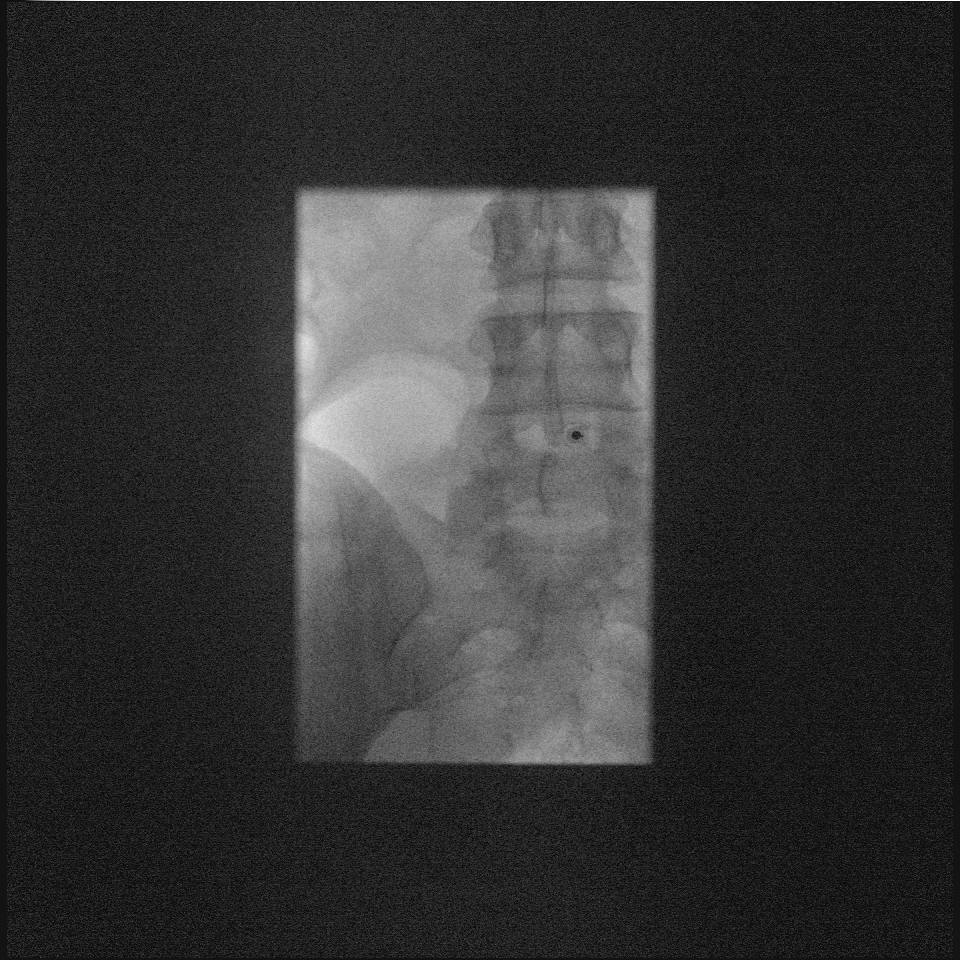

[1 of 1 positions shown; findings below may reference images not displayed]

EXAM:
DIAGNOSTIC LUMBAR PUNCTURE UNDER FLUOROSCOPIC GUIDANCE

FLUOROSCOPY TIME:  Fluoroscopy Time:  0.1 minute

Radiation Exposure Index (if provided by the fluoroscopic device):
0.4 mGy

Number of Acquired Spot Images: 0

PROCEDURE:
Informed consent was obtained from the patient prior to the
procedure, including potential complications of headache, allergy,
and pain. With the patient prone, the lower back was prepped with
Betadine. 1% Lidocaine was used for local anesthesia. Lumbar
puncture was performed at the L4-5 level using a 22 gauge needle
with return of clear CSF with an opening pressure of 13 cm water. 8
ml of CSF were obtained for laboratory studies. The patient
tolerated the procedure well and there were no apparent
complications.
IMPRESSION: 1. Successful fluoroscopic guided lumbar puncture.

## 2020-01-15 ENCOUNTER — Other Ambulatory Visit: Payer: Self-pay

## 2020-01-15 ENCOUNTER — Ambulatory Visit: Payer: PRIVATE HEALTH INSURANCE | Admitting: Family

## 2020-01-15 DIAGNOSIS — K5900 Constipation, unspecified: Secondary | ICD-10-CM

## 2020-01-15 DIAGNOSIS — R112 Nausea with vomiting, unspecified: Secondary | ICD-10-CM

## 2020-01-15 MED ORDER — ONDANSETRON 4 MG PO TBDP: 4.0000 mg | ORAL_TABLET | Freq: Four times a day (QID) | ORAL | 0 refills | Status: DC | PRN

## 2020-01-15 NOTE — Progress Notes (Signed)
Acute Office Visit  Subjective:    Patient ID: Mariah Trevino, female    DOB: Jul 15, 1996, 24 y.o.   MRN: [id number]  Chief Complaint  Patient presents with  . Emesis    HPI Patient is in today with c/o vomiting.  She has had episodes of vomiting since she was a child.  She use to think it was from "eating too late." She has recently noticed that the episodes of vomiting are occurring more frequently.  Some episodes are a few hours, some last several days.  She cannot tolerate solid food during these episodes but is able to tolerate fluids.  Pt was diagnosed with chiari malformation in 2018 by Dr. Sherryll Burger with [facility].  Pt is on meds for chronic headaches associated with this disorder.  Pt takes a medication from home ([place]) for N/V given to pt by mother (who is a Engineer, civil (consulting)).  Medication is comparable to Meclizine. Pt has irregular bowel movements.  She has been 5 days since last BM.  She has gone as long as 2 weeks.  Sometimes she sees bright red blood when she wipes.  No family hx of similar symptoms.   Past Surgical History:  Procedure Laterality Date  . ANTERIOR CRUCIATE LIGAMENT REPAIR Left 07/2016  . MENISCUS REPAIR Left     No family history on file.  No Known Allergies  Review of Systems  Constitutional: Positive for appetite change.  Gastrointestinal: Positive for abdominal pain, anal bleeding, constipation, nausea and vomiting.       Objective:    Physical Exam Constitutional:      Appearance: Normal appearance. She is normal weight.  Abdominal:     General: Bowel sounds are normal.     Palpations: Abdomen is soft.  Neurological:     Mental Status: She is alert.     There were no vitals taken for this visit. Wt Readings from Last 3 Encounters:  12/15/18 155 lb (70.3 kg)     Lab Results  Component Value Date   TSH 1.110 08/16/2017   Lab Results  Component Value Date   WBC 5.3 02/09/2019   HGB 13.7 02/09/2019   HCT 40.1 02/09/2019   MCV 89  02/09/2019   PLT 285 02/09/2019   Lab Results  Component Value Date   NA 138 02/09/2019   K 3.4 (L) 02/09/2019   CO2 14 (L) 02/09/2019   GLUCOSE 145 (H) 02/09/2019   BUN 11 02/09/2019   CREATININE 0.83 02/09/2019   BILITOT 0.2 02/09/2019   ALKPHOS 70 02/09/2019   AST 12 02/09/2019   ALT 9 02/09/2019   PROT 7.0 02/09/2019   ALBUMIN 4.0 02/09/2019   CALCIUM 8.7 02/09/2019       Assessment & Plan:   Problem List Items Addressed This Visit    None    Visit Diagnoses    Nausea and vomiting, intractability of vomiting not specified, unspecified vomiting type    -  Primary   Relevant Medications   ondansetron (ZOFRAN ODT) 4 MG disintegrating tablet   Other Relevant Orders   Ambulatory referral to Gastroenterology   Constipation, unspecified constipation type       Relevant Orders   Ambulatory referral to Gastroenterology     Reviewed exam findings and HPI. Will refer to GI.  Start otc Pepcid bid and Align probiotic as directed.  Start symptom diary to further help correlate symptoms.  [facility] [place].  Stop "Meclizine-like" medication at this time.  Pt will  schedule follow up with Dr. Manuella Ghazi at [facility] since she has not had a re-eval since last Spring (2020).  Email or rtc with any new or worsening symptoms.  Meds ordered this encounter  Medications  . ondansetron (ZOFRAN ODT) 4 MG disintegrating tablet    Sig: Take 1 tablet (4 mg total) by mouth every 6 (six) hours as needed for nausea or vomiting.    Dispense:  20 tablet    Refill:  0    Order Specific Question:   Supervising Provider    Answer:   Dutch Gray [[id number]]     Kolter Reaver, Berdie Ogren, NP

## 2020-01-21 ENCOUNTER — Ambulatory Visit (INDEPENDENT_AMBULATORY_CARE_PROVIDER_SITE_OTHER): Payer: PRIVATE HEALTH INSURANCE | Admitting: Gastroenterology

## 2020-01-21 ENCOUNTER — Other Ambulatory Visit: Payer: Self-pay

## 2020-01-21 VITALS — BP 118/80 | HR 97 | Temp 98.1°F | Ht 63.0 in | Wt 165.8 lb

## 2020-01-21 DIAGNOSIS — K219 Gastro-esophageal reflux disease without esophagitis: Secondary | ICD-10-CM | POA: Diagnosis not present

## 2020-01-21 DIAGNOSIS — K625 Hemorrhage of anus and rectum: Secondary | ICD-10-CM

## 2020-01-21 DIAGNOSIS — K5909 Other constipation: Secondary | ICD-10-CM | POA: Diagnosis not present

## 2020-01-21 NOTE — Progress Notes (Signed)
Jonathon Bellows MD, MRCP(U.K) [address]  [place]  [place], [place] [address]  Main: [phone]  Fax: [phone]   Gastroenterology Consultation  Referring Provider:     Paulina Fusi, MD Primary Care Physician:  Paulina Fusi, MD Primary Gastroenterologist:  Dr. Jonathon Bellows  Reason for Consultation:     Constipation, nausea and vomiting        HPI:   Mariah Trevino is a 24 y.o. y/o female referred for consultation & management  by Dr. Paulina Fusi, MD.  * No recent labs : last checked in 02/2019: Normal CBC and CMP  She states that since last fall she has been suffering from constipation which has not improved.  She has a bowel movement once a week.  No change in the shape.  No weight loss.  She has noticed dark blood mixed with the stool on occasions.  Denies any NSAID use.  Denies any family history of colon cancer or polyps.  She consumes fruit and vegetable on a daily basis.  Not tried any laxative recently tried.  She also has a history of heartburn and chest discomfort along with nausea and vomiting usually in the mornings.  Usually worse when she lays flat.  Ongoing for a few years.  No recent weight gain or loss.  Has recently been commenced on Pepcid has only taken a few doses.  No past medical history on file.  Past Surgical History:  Procedure Laterality Date  . ANTERIOR CRUCIATE LIGAMENT REPAIR Left 07/2016  . MENISCUS REPAIR Left     Prior to Admission medications   Medication Sig Start Date End Date Taking? Authorizing Provider  acetaZOLAMIDE (DIAMOX) 250 MG tablet Take 250 mg by mouth 2 (two) times daily.    [provider]  nortriptyline (PAMELOR) 50 MG capsule Take 50 mg by mouth at bedtime.    [provider]  ondansetron (ZOFRAN ODT) 4 MG disintegrating tablet Take 1 tablet (4 mg total) by mouth every 6 (six) hours as needed for nausea or vomiting. 01/15/20   Lanice Schwab, NP  topiramate (TOPAMAX) 50 MG tablet Take 50 mg by mouth  daily.    [provider]    No family history on file.   Social History   Tobacco Use  . Smoking status: Never Smoker  . Smokeless tobacco: Never Used  Substance Use Topics  . Alcohol use: No  . Drug use: Never    Allergies as of 01/21/2020  . (No Known Allergies)    Review of Systems:    All systems reviewed and negative except where noted in HPI.   Physical Exam:  There were no vitals taken for this visit. No LMP recorded. Psych:  Alert and cooperative. Normal mood and affect. General:   Alert,  Well-developed, well-nourished, pleasant and cooperative in NAD Head:  Normocephalic and atraumatic. Eyes:  Sclera clear, no icterus.   Conjunctiva pink. Ears:  Normal auditory acuity. Abdomen:  Normal bowel sounds.  No bruits.  Soft, non-tender and non-distended without masses, hepatosplenomegaly or hernias noted.  No guarding or rebound tenderness.    Neurologic:  Alert and oriented x3;  grossly normal neurologically. Psych:  Alert and cooperative. Normal mood and affect.  Imaging Studies: No results found.  Assessment and Plan:   Mariah Trevino is a 24 y.o. y/o female has been referred for constipation, nausea and vomiting.  Longstanding history of nausea vomiting at times but preceded with heartburn.  Very likely she has acid reflux.  Since she has it at a very young age and potentially may need medications for many years to come, I would like to perform an endoscopy to rule out a hiatal hernia.  If present can discuss treatment options at next visit.  Constipation ongoing since fall associated with blood mixed with the stool.   Plan  1. Check CBC,CMP,TSH 2. High fiber diet - patient information provide 3.  GERD patient information provided.  The picture of a wedge pillow has been provided.  I would suggest her to continue the Pepcid and if no better will change to a proton pump inhibitor.  We will proceed with EGD. 4.  Constipation along with blood mixed with  the stool.  Will commence on MiraLAX 1-2 times a day.  Will provide information on the high-fiber diet.  We will also proceed with colonoscopy to rule out causes such as colitis or juvenile polyps.  I have discussed alternative options, risks & benefits,  which include, but are not limited to, bleeding, infection, perforation,respiratory complication & drug reaction.  The patient agrees with this plan & written consent will be obtained.      Follow up in 6 to 8 weeks  Dr Wyline Mood MD,MRCP(U.K)

## 2020-01-21 NOTE — Patient Instructions (Signed)

## 2020-01-22 ENCOUNTER — Encounter: Payer: Self-pay | Admitting: Gastroenterology

## 2020-01-22 LAB — TSH: TSH: 0.977 u[IU]/mL (ref 0.450–4.500)

## 2020-01-22 LAB — CBC WITH DIFFERENTIAL/PLATELET
Basophils Absolute: 0 10*3/uL (ref 0.0–0.2)
Basos: 1 %
EOS (ABSOLUTE): 0 10*3/uL (ref 0.0–0.4)
Eos: 0 %
Hematocrit: 39.9 % (ref 34.0–46.6)
Hemoglobin: 13.5 g/dL (ref 11.1–15.9)
Immature Grans (Abs): 0 10*3/uL (ref 0.0–0.1)
Immature Granulocytes: 0 %
Lymphocytes Absolute: 2.2 10*3/uL (ref 0.7–3.1)
Lymphs: 31 %
MCH: 29.9 pg (ref 26.6–33.0)
MCHC: 33.8 g/dL (ref 31.5–35.7)
MCV: 88 fL (ref 79–97)
Monocytes Absolute: 0.3 10*3/uL (ref 0.1–0.9)
Monocytes: 4 %
Neutrophils Absolute: 4.7 10*3/uL (ref 1.4–7.0)
Neutrophils: 64 %
Platelets: 325 10*3/uL (ref 150–450)
RBC: 4.52 x10E6/uL (ref 3.77–5.28)
RDW: 11.8 % (ref 11.7–15.4)
WBC: 7.3 10*3/uL (ref 3.4–10.8)

## 2020-01-22 LAB — COMPREHENSIVE METABOLIC PANEL
ALT: 16 IU/L (ref 0–32)
AST: 18 IU/L (ref 0–40)
Albumin/Globulin Ratio: 1.6 (ref 1.2–2.2)
Albumin: 4.4 g/dL (ref 3.9–5.0)
Alkaline Phosphatase: 79 IU/L (ref 39–117)
BUN/Creatinine Ratio: 11 (ref 9–23)
BUN: 10 mg/dL (ref 6–20)
Bilirubin Total: 0.3 mg/dL (ref 0.0–1.2)
CO2: 16 mmol/L — ABNORMAL LOW (ref 20–29)
Calcium: 9.2 mg/dL (ref 8.7–10.2)
Chloride: 107 mmol/L — ABNORMAL HIGH (ref 96–106)
Creatinine, Ser: 0.89 mg/dL (ref 0.57–1.00)
GFR calc Af Amer: 106 mL/min/{1.73_m2} (ref >59)
GFR calc non Af Amer: 92 mL/min/{1.73_m2} (ref >59)
Globulin, Total: 2.8 g/dL (ref 1.5–4.5)
Glucose: 125 mg/dL — ABNORMAL HIGH (ref 65–99)
Potassium: 4 mmol/L (ref 3.5–5.2)
Sodium: 138 mmol/L (ref 134–144)
Total Protein: 7.2 g/dL (ref 6.0–8.5)

## 2020-01-28 ENCOUNTER — Other Ambulatory Visit: Payer: Self-pay

## 2020-01-28 ENCOUNTER — Ambulatory Visit: Payer: PRIVATE HEALTH INSURANCE | Admitting: Family

## 2020-01-28 VITALS — Temp 99.6°F

## 2020-01-28 DIAGNOSIS — U071 COVID-19: Secondary | ICD-10-CM

## 2020-01-28 NOTE — Progress Notes (Signed)
   Established Patient Office Visit  Subjective:  Patient ID: Mariah Trevino, female    DOB: 11-Nov-1995  Age: 24 y.o. MRN: [id number]  CC:  Chief Complaint  Patient presents with  . Follow-up    HPI Mariah Trevino presents with concern around Covid 19 diagnosis.  Pt tested positive via PCR in [place] on 01/22/20. Pt was asymptomatic.  Pt doubted this result and independently chose to [facility] 2 repeat [facility], both were negative (4/16 and 01/27/20).  Pt continues to be asymptomatic. She reached immunity post Pfeizer vaccine today (01/28/20).  Pt requests to be released from quarantine.  The soccer [employer] tour[employer] starts next week for her and her team.  No past medical history on file.  No family history on file.   Outpatient Medications Prior to Visit  Medication Sig Dispense Refill  . acetaZOLAMIDE (DIAMOX) 250 MG tablet Take 250 mg by mouth 2 (two) times daily.    . nortriptyline (PAMELOR) 50 MG capsule Take 50 mg by mouth at bedtime.    . ondansetron (ZOFRAN ODT) 4 MG disintegrating tablet Take 1 tablet (4 mg total) by mouth every 6 (six) hours as needed for nausea or vomiting. 20 tablet 0  . topiramate (TOPAMAX) 50 MG tablet Take 50 mg by mouth daily.     No facility-administered medications prior to visit.    No Known Allergies  ROS Review of Systems  Constitutional: Negative.   HENT: Negative.   Respiratory: Negative.   Gastrointestinal: Negative.       Objective:    Physical Exam  Constitutional: She is oriented to person, place, and time. She appears well-developed and well-nourished.  HENT:  Right Ear: External ear normal.  Left Ear: External ear normal.  Mouth/Throat: Oropharynx is clear and moist.  Cardiovascular: Normal rate, regular rhythm and normal heart sounds.  Pulmonary/Chest: Effort normal and breath sounds normal.  Musculoskeletal:     Cervical back: Neck supple.  Neurological: She is alert and oriented to person, place, and time.  Skin: Skin  is warm and dry.    Temp 99.6 F (37.6 C) (Temporal)  Wt Readings from Last 3 Encounters:  01/21/20 165 lb 12.8 oz (75.2 kg)  12/15/18 155 lb (70.3 kg)      Assessment & Plan:   Problem List Items Addressed This Visit    None     Reviewed pts request with Dr. Andrey Spearman.  Pt's request is sent to Vernelle Emerald with the [facility].  No promises made to pt, simply that her request to leave quarantine has been heard.  Will email pt with update once available.  See scanned docs for copies of Covid results.  Follow-up: No follow-ups on file.    Mariah Trevino, Deirdre Peer, NP

## 2020-01-30 ENCOUNTER — Other Ambulatory Visit: Payer: Self-pay | Admitting: Family

## 2020-01-30 DIAGNOSIS — U071 COVID-19: Secondary | ICD-10-CM

## 2020-01-30 NOTE — Progress Notes (Signed)
Order for Complete Echo and Troponin level sent per [employer] and [employer] protocol for Covid 19 positive athletes to return to play. 

## 2020-01-31 ENCOUNTER — Telehealth: Payer: Self-pay

## 2020-01-31 ENCOUNTER — Other Ambulatory Visit: Payer: Self-pay

## 2020-01-31 ENCOUNTER — Ambulatory Visit: Payer: PRIVATE HEALTH INSURANCE | Admitting: Family

## 2020-01-31 ENCOUNTER — Other Ambulatory Visit: Payer: PRIVATE HEALTH INSURANCE

## 2020-01-31 DIAGNOSIS — U071 COVID-19: Secondary | ICD-10-CM

## 2020-01-31 NOTE — Telephone Encounter (Signed)
Pt left vm requesting to reschedule her endoscopy procedure. We were able to reschedule to 02-12-20. Pt states she tested positive for COVID on 01-22-20 pt is aware that she'll need to fax her results to our office prior to her procedure as [facility] require a hard copy of the result. Pt agrees.

## 2020-01-31 NOTE — Progress Notes (Signed)
   Established Patient Office Visit  Subjective:  Patient ID: Mariah Trevino, female    DOB: Jul 08, 1996  Age: 24 y.o. MRN: [id number]  CC:  Chief Complaint  Patient presents with  . Follow-up    HPI Mariah Trevino presents for post Covid 19 EKG as advised prior to return to soccer. Pt has been doing well.  She is asymptomatic and has been her entire quarantine.  No fever.  No otc meds.  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  Pt will be leaving quarantine on 02/01/20.   Outpatient Medications Prior to Visit  Medication Sig Dispense Refill  . acetaZOLAMIDE (DIAMOX) 250 MG tablet Take 250 mg by mouth 2 (two) times daily.    . nortriptyline (PAMELOR) 50 MG capsule Take 50 mg by mouth at bedtime.    . ondansetron (ZOFRAN ODT) 4 MG disintegrating tablet Take 1 tablet (4 mg total) by mouth every 6 (six) hours as needed for nausea or vomiting. 20 tablet 0  . topiramate (TOPAMAX) 50 MG tablet Take 50 mg by mouth daily.     No facility-administered medications prior to visit.    No Known Allergies  ROS Review of Systems  Constitutional: Negative.   HENT: Negative.   Respiratory: Negative.   Cardiovascular: Negative.       Objective:    Physical Exam  Constitutional: She appears well-developed and well-nourished.  HENT:  Right Ear: External ear normal.  Left Ear: External ear normal.  Mouth/Throat: Oropharynx is clear and moist.  Cardiovascular: Normal rate, regular rhythm and normal heart sounds.  Pulmonary/Chest: Effort normal and breath sounds normal.  Musculoskeletal:     Cervical back: Neck supple.  Skin: Skin is warm and dry.    There were no vitals taken for this visit. Wt Readings from Last 3 Encounters:  01/21/20 165 lb 12.8 oz (75.2 kg)  12/15/18 155 lb (70.3 kg)       Assessment & Plan:   Problem List Items Addressed This Visit    None    Visit Diagnoses    COVID-19    -  Primary     Reviewed exam and HPI.  Will send pt's EKG for  final sign off from Cardiology.  Troponin ordered for pt to obtained after she is released from quarantine.  Once EKG and Troponin levels are signed off on, pt may gradually return to play.  She may travel with team as of 02/02/20.  Event organiser, Mariah Trevino notified of plan.  Pt verbalizes understanding.  Pt will follow up with any concerns or new symptoms. Mariah Trevino included in the discussion of this plan.  No orders of the defined types were placed in this encounter.   Follow-up: No follow-ups on file.    Mariah Trevino, Mariah Peer, NP

## 2020-02-01 ENCOUNTER — Other Ambulatory Visit: Payer: PRIVATE HEALTH INSURANCE

## 2020-02-04 ENCOUNTER — Ambulatory Visit
Admission: RE | Admit: 2020-02-04 | Discharge: 2020-02-04 | Disposition: A | Discharge status: Home / Self Care | Payer: 59 | Source: Ambulatory Visit | Attending: Family | Admitting: Family

## 2020-02-04 ENCOUNTER — Other Ambulatory Visit
Admission: RE | Admit: 2020-02-04 | Discharge: 2020-02-04 | Disposition: A | Discharge status: Home / Self Care | Payer: 59 | Source: Ambulatory Visit | Attending: Family | Admitting: Family

## 2020-02-04 DIAGNOSIS — Z8616 Personal history of COVID-19: Secondary | ICD-10-CM | POA: Insufficient documentation

## 2020-02-04 DIAGNOSIS — Z09 Encounter for follow-up examination after completed treatment for conditions other than malignant neoplasm: Secondary | ICD-10-CM | POA: Diagnosis present

## 2020-02-04 DIAGNOSIS — U071 COVID-19: Secondary | ICD-10-CM

## 2020-02-04 LAB — TROPONIN I (HIGH SENSITIVITY): Troponin I (High Sensitivity): 3 ng/L (ref <18)

## 2020-02-04 NOTE — Progress Notes (Signed)
*  PRELIMINARY RESULTS* Echocardiogram 2D Echocardiogram has been performed.  Mariah Trevino 02/04/2020, 11:41 AM 

## 2020-02-04 NOTE — Progress Notes (Signed)
*  PRELIMINARY RESULTS* Echocardiogram 2D Echocardiogram has been performed.  Cristela Blue 02/04/2020, 9:38 AM

## 2020-02-05 ENCOUNTER — Ambulatory Visit: Payer: 59

## 2020-02-06 ENCOUNTER — Ambulatory Visit: Payer: PRIVATE HEALTH INSURANCE

## 2020-02-08 ENCOUNTER — Other Ambulatory Visit: Admission: RE | Admit: 2020-02-08 | Payer: PRIVATE HEALTH INSURANCE | Source: Ambulatory Visit

## 2020-02-11 ENCOUNTER — Other Ambulatory Visit: Payer: Self-pay

## 2020-02-11 MED ORDER — PEG 3350-KCL-NABCB-NACL-NASULF 236 G PO SOLR: ORAL | 0 refills | Status: DC

## 2020-02-12 ENCOUNTER — Ambulatory Visit: Payer: 59 | Admitting: Certified Registered"

## 2020-02-12 ENCOUNTER — Other Ambulatory Visit: Payer: Self-pay

## 2020-02-12 ENCOUNTER — Ambulatory Visit
Admission: RE | Admit: 2020-02-12 | Discharge: 2020-02-12 | Disposition: A | Discharge status: Home / Self Care | Payer: 59 | Attending: Gastroenterology | Admitting: Gastroenterology

## 2020-02-12 ENCOUNTER — Encounter
Admission: RE | Disposition: A | Discharge status: Home / Self Care | Payer: Self-pay | Source: Home / Self Care | Attending: Gastroenterology

## 2020-02-12 ENCOUNTER — Encounter: Payer: Self-pay | Admitting: Gastroenterology

## 2020-02-12 DIAGNOSIS — K635 Polyp of colon: Secondary | ICD-10-CM | POA: Insufficient documentation

## 2020-02-12 DIAGNOSIS — K625 Hemorrhage of anus and rectum: Secondary | ICD-10-CM | POA: Diagnosis not present

## 2020-02-12 DIAGNOSIS — K219 Gastro-esophageal reflux disease without esophagitis: Secondary | ICD-10-CM

## 2020-02-12 DIAGNOSIS — R110 Nausea: Secondary | ICD-10-CM | POA: Diagnosis not present

## 2020-02-12 DIAGNOSIS — Z79899 Other long term (current) drug therapy: Secondary | ICD-10-CM | POA: Insufficient documentation

## 2020-02-12 HISTORY — DX: Gastro-esophageal reflux disease without esophagitis: K21.9

## 2020-02-12 HISTORY — PX: ESOPHAGOGASTRODUODENOSCOPY (EGD) WITH PROPOFOL: SHX5813

## 2020-02-12 HISTORY — PX: COLONOSCOPY WITH PROPOFOL: SHX5780

## 2020-02-12 LAB — POCT PREGNANCY, URINE: Preg Test, Ur: NEGATIVE

## 2020-02-12 SURGERY — COLONOSCOPY WITH PROPOFOL
Anesthesia: General

## 2020-02-12 MED ORDER — MIDAZOLAM HCL 2 MG/2ML IJ SOLN
INTRAMUSCULAR | Status: AC
  Filled 2020-02-12: qty 2

## 2020-02-12 MED ORDER — LIDOCAINE HCL (CARDIAC) PF 100 MG/5ML IV SOSY
PREFILLED_SYRINGE | INTRAVENOUS | Status: DC | PRN
  Administered 2020-02-12: 100 mg via INTRATRACHEAL

## 2020-02-12 MED ORDER — GLYCOPYRROLATE 0.2 MG/ML IJ SOLN
INTRAMUSCULAR | Status: DC | PRN
  Administered 2020-02-12: .2 mg via INTRAVENOUS

## 2020-02-12 MED ORDER — MIDAZOLAM HCL 2 MG/2ML IJ SOLN
INTRAMUSCULAR | Status: DC | PRN
  Administered 2020-02-12: 2 mg via INTRAVENOUS

## 2020-02-12 MED ORDER — SODIUM CHLORIDE 0.9 % IV SOLN
INTRAVENOUS | Status: DC
  Administered 2020-02-12: 1000 mL via INTRAVENOUS

## 2020-02-12 MED ORDER — PROPOFOL 500 MG/50ML IV EMUL
INTRAVENOUS | Status: DC | PRN
  Administered 2020-02-12: 175 ug/kg/min via INTRAVENOUS

## 2020-02-12 MED ORDER — PROPOFOL 10 MG/ML IV BOLUS
INTRAVENOUS | Status: DC | PRN
  Administered 2020-02-12: 20 mg via INTRAVENOUS
  Administered 2020-02-12: 60 mg via INTRAVENOUS
  Administered 2020-02-12: 20 mg via INTRAVENOUS
  Administered 2020-02-12: 10 mg via INTRAVENOUS

## 2020-02-12 MED ORDER — PHENYLEPHRINE HCL (PRESSORS) 10 MG/ML IV SOLN
INTRAVENOUS | Status: DC | PRN
  Administered 2020-02-12: 100 ug via INTRAVENOUS

## 2020-02-12 NOTE — Op Note (Signed)
[facility] Gastroenterology Patient Name: Mariah Trevino Procedure Date: 02/12/2020 9:40 AM MRN: [id number] Account #: [id number] Date of Birth: 16-Nov-1995 Admit Type: Outpatient Age: 24 Room: [facility] ENDO ROOM 1 Gender: Female Note Status: Finalized Procedure:             Upper GI endoscopy Indications:           Nausea Providers:             Wyline Mood MD, MD Medicines:             Monitored Anesthesia Care Complications:         No immediate complications. Procedure:             Pre-Anesthesia Assessment:                        - Prior to the procedure, a History and Physical was                         performed, and patient medications, allergies and                         sensitivities were reviewed. The patient's tolerance                         of previous anesthesia was reviewed.                        - The risks and benefits of the procedure and the                         sedation options and risks were discussed with the                         patient. All questions were answered and informed                         consent was obtained.                        - ASA Grade Assessment: II - A patient with mild                         systemic disease.                        After obtaining informed consent, the endoscope was                         passed under direct vision. Throughout the procedure,                         the patient's blood pressure, pulse, and oxygen                         saturations were monitored continuously. The Endoscope                         was introduced through the mouth, and advanced to the  third part of duodenum. The upper GI endoscopy was                         accomplished with ease. The patient tolerated the                         procedure well. Findings:      The cardia and gastric fundus were normal on retroflexion.      The esophagus was normal.      The stomach was normal.  The examined duodenum was normal. Impression:            - Normal esophagus.                        - Normal stomach.                        - Normal examined duodenum.                        - No specimens collected. Recommendation:        - Perform a colonoscopy today. Procedure Code(s):     --- Professional ---                        [phone], Esophagogastroduodenoscopy, flexible,                         transoral; diagnostic, including collection of                         specimen(s) by brushing or washing, when performed                         (separate procedure) Diagnosis Code(s):     --- Professional ---                        R11.0, Nausea CPT copyright 2019 American Medical Association. All rights reserved. The codes documented in this report are preliminary and upon coder review may  be revised to meet current compliance requirements. Jonathon Bellows, MD Jonathon Bellows MD, MD 02/12/2020 10:17:45 AM This report has been signed electronically. Number of Addenda: 0 Note Initiated On: 02/12/2020 9:40 AM Estimated Blood Loss:  Estimated blood loss: none.      [facility]

## 2020-02-12 NOTE — Op Note (Signed)
[facility] Gastroenterology Patient Name: Mariah Trevino Procedure Date: 02/12/2020 9:40 AM MRN: [id number] Account #: [id number] Date of Birth: 1996/05/28 Admit Type: Outpatient Age: 24 Room: [facility] ENDO ROOM 1 Gender: Female Note Status: Finalized Procedure:             Colonoscopy Indications:           Rectal bleeding Providers:             Jonathon Bellows MD, MD Medicines:             Monitored Anesthesia Care Complications:         No immediate complications. Procedure:             Pre-Anesthesia Assessment:                        - Prior to the procedure, a History and Physical was                         performed, and patient medications, allergies and                         sensitivities were reviewed. The patient's tolerance                         of previous anesthesia was reviewed.                        - The risks and benefits of the procedure and the                         sedation options and risks were discussed with the                         patient. All questions were answered and informed                         consent was obtained.                        - ASA Grade Assessment: II - A patient with mild                         systemic disease.                        After obtaining informed consent, the colonoscope was                         passed under direct vision. Throughout the procedure,                         the patient's blood pressure, pulse, and oxygen                         saturations were monitored continuously. The                         Colonoscope was introduced through the anus and  advanced to the the cecum, identified by the                         appendiceal orifice. The colonoscopy was performed                         with ease. The patient tolerated the procedure well.                         The quality of the bowel preparation was fair. Findings:      The perianal and digital rectal  examinations were normal.      A 4 mm polyp was found in the cecum. The polyp was sessile. The polyp       was removed with a cold snare. Resection was complete, but the polyp       tissue was not retrieved since the prep was poor and was sucked out with       a lot of stool and could not be found .      The exam was otherwise without abnormality on direct and retroflexion       views. Impression:            - Preparation of the colon was fair.                        - One 4 mm polyp in the cecum, removed with a cold                         snare. Complete resection. Polyp tissue not retrieved.                        - The examination was otherwise normal on direct and                         retroflexion views.                        - No specimens collected. Recommendation:        - Discharge patient to home (with escort).                        - Resume previous diet.                        - Continue present medications.                        - Return to my office as previously scheduled.                        - Pick up samples of Trulance from my office today Procedure Code(s):     --- Professional ---                        [phone], Colonoscopy, flexible; diagnostic, including                         collection of specimen(s) by brushing or washing, when  performed (separate procedure) Diagnosis Code(s):     --- Professional ---                        K63.5, Polyp of colon                        K62.5, Hemorrhage of anus and rectum CPT copyright 2019 American Medical Association. All rights reserved. The codes documented in this report are preliminary and upon coder review may  be revised to meet current compliance requirements. Wyline Mood, MD Wyline Mood MD, MD 02/12/2020 10:35:52 AM This report has been signed electronically. Number of Addenda: 0 Note Initiated On: 02/12/2020 9:40 AM Scope Withdrawal Time: 0 hours 9 minutes 54 seconds  Total Procedure  Duration: 0 hours 13 minutes 33 seconds  Estimated Blood Loss:  Estimated blood loss: none.      [facility]

## 2020-02-12 NOTE — Anesthesia Postprocedure Evaluation (Signed)
Anesthesia Post Note  Patient: Mariah Trevino  Procedure(s) Performed: COLONOSCOPY WITH PROPOFOL (N/A ) ESOPHAGOGASTRODUODENOSCOPY (EGD) WITH PROPOFOL (N/A )  Patient location during evaluation: Endoscopy Anesthesia Type: General Level of consciousness: awake and alert Pain management: pain level controlled Vital Signs Assessment: post-procedure vital signs reviewed and stable Respiratory status: spontaneous breathing and respiratory function stable Cardiovascular status: stable Anesthetic complications: no     Last Vitals:  Vitals:   02/12/20 1108 02/12/20 1110  BP: 111/80 111/80  Pulse: 92 89  Resp: 19 15  Temp:    SpO2: 100% 100%    Last Pain:  Vitals:   02/12/20 1058  TempSrc:   PainSc: (P) 0-No pain                 [name]

## 2020-02-12 NOTE — H&P (Signed)
Wyline Mood, MD [address], [address], [place], [place], [address] [address], [place], [place], [address] Phone: [phone]  Fax: [phone]  Primary Care Physician:  Jolene Provost, MD   Pre-Procedure History & Physical: HPI:  Mariah Trevino is a 24 y.o. female is here for an endoscopy and colonoscopy    Past Medical History:  Diagnosis Date  . GERD (gastroesophageal reflux disease)     Past Surgical History:  Procedure Laterality Date  . ANTERIOR CRUCIATE LIGAMENT REPAIR Left 07/2016  . MENISCUS REPAIR Left     Prior to Admission medications   Medication Sig Start Date End Date Taking? Authorizing Provider  acetaZOLAMIDE (DIAMOX) 250 MG tablet Take 250 mg by mouth 2 (two) times daily.    [provider]  nortriptyline (PAMELOR) 50 MG capsule Take 50 mg by mouth at bedtime.    [provider]  ondansetron (ZOFRAN ODT) 4 MG disintegrating tablet Take 1 tablet (4 mg total) by mouth every 6 (six) hours as needed for nausea or vomiting. 01/15/20   Ronne Binning, NP    Allergies as of 01/21/2020  . (No Known Allergies)    History reviewed. No pertinent family history.  Social History   Socioeconomic History  . Marital status: Single    Spouse name: Not on file  . Number of children: Not on file  . Years of education: Not on file  . Highest education level: Not on file  Occupational History  . Not on file  Tobacco Use  . Smoking status: Never Smoker  . Smokeless tobacco: Never Used  Substance and Sexual Activity  . Alcohol use: No  . Drug use: Never  . Sexual activity: Not on file  Other Topics Concern  . Not on file  Social History Narrative  . Not on file   Social Determinants of Health   Financial Resource Strain:   . Difficulty of Paying Living Expenses:   Food Insecurity:   . Worried About Programme researcher, broadcasting/film/video in the Last Year:   . Barista in the Last Year:   Transportation Needs:   . Automotive engineer (Medical):   Marland Kitchen Lack of Transportation (Non-Medical):   Physical Activity:   . Days of Exercise per Week:   . Minutes of Exercise per Session:   Stress:   . Feeling of Stress :   Social Connections:   . Frequency of Communication with Friends and Family:   . Frequency of Social Gatherings with Friends and Family:   . Attends Religious Services:   . Active Member of Clubs or Organizations:   . Attends Banker Meetings:   Marland Kitchen Marital Status:   Intimate Partner Violence:   . Fear of Current or Ex-Partner:   . Emotionally Abused:   Marland Kitchen Physically Abused:   . Sexually Abused:     Review of Systems: See HPI, otherwise negative ROS  Physical Exam: There were no vitals taken for this visit. General:   Alert,  pleasant and cooperative in NAD Head:  Normocephalic and atraumatic. Neck:  Supple; no masses or thyromegaly. Lungs:  Clear throughout to auscultation, normal respiratory effort.    Heart:  +S1, +S2, Regular rate and rhythm, No edema. Abdomen:  Soft, nontender and nondistended. Normal bowel sounds, without guarding, and without rebound.   Neurologic:  Alert and  oriented x4;  grossly normal neurologically.  Impression/Plan: Mariah Trevino is here for an endoscopy and colonoscopy  to be performed for  evaluation of GERD and blood in stool    Risks, benefits, limitations, and alternatives regarding endoscopy have been reviewed with the patient.  Questions have been answered.  All parties agreeable.   Jonathon Bellows, MD  02/12/2020, 9:45 AM

## 2020-02-12 NOTE — Anesthesia Preprocedure Evaluation (Signed)
Anesthesia Evaluation  Patient identified by MRN, date of birth, ID band Patient awake    Reviewed: Allergy & Precautions, NPO status , Patient's Chart, lab work & pertinent test results  History of Anesthesia Complications Negative for: history of anesthetic complications  Airway Mallampati: II       Dental   Pulmonary neg sleep apnea, neg COPD, Not current smoker,           Cardiovascular (-) hypertension(-) Past MI and (-) CHF (-) dysrhythmias (-) Valvular Problems/Murmurs     Neuro/Psych neg Seizures    GI/Hepatic Neg liver ROS, GERD  ,  Endo/Other  neg diabetes  Renal/GU negative Renal ROS     Musculoskeletal   Abdominal   Peds  Hematology   Anesthesia Other Findings   Reproductive/Obstetrics                             Anesthesia Physical Anesthesia Plan  ASA: II  Anesthesia Plan: General   Post-op Pain Management:    Induction: Intravenous  PONV Risk Score and Plan: 3 and Propofol infusion, TIVA and Treatment may vary due to age or medical condition  Airway Management Planned: Nasal Cannula  Additional Equipment:   Intra-op Plan:   Post-operative Plan:   Informed Consent: I have reviewed the patients History and Physical, chart, labs and discussed the procedure including the risks, benefits and alternatives for the proposed anesthesia with the patient or authorized representative who has indicated his/her understanding and acceptance.       Plan Discussed with:   Anesthesia Plan Comments:         Anesthesia Quick Evaluation  

## 2020-02-12 NOTE — Transfer of Care (Signed)
Immediate Anesthesia Transfer of Care Note  Patient: Mariah Trevino  Procedure(s) Performed: COLONOSCOPY WITH PROPOFOL (N/A ) ESOPHAGOGASTRODUODENOSCOPY (EGD) WITH PROPOFOL (N/A )  Patient Location: Endoscopy Unit  Anesthesia Type:General  Level of Consciousness: drowsy, patient cooperative and responds to stimulation  Airway & Oxygen Therapy: Patient Spontanous Breathing and Patient connected to face mask oxygen  Post-op Assessment: Report given to RN and Post -op Vital signs reviewed and stable  Post vital signs: Reviewed and stable  Last Vitals:  Vitals Value Taken Time  BP 111/64 02/12/20 1039  Temp 36.4 C 02/12/20 1038  Pulse 94 02/12/20 1040  Resp 19 02/12/20 1040  SpO2 99 % 02/12/20 1040  Vitals shown include unvalidated device data.  Last Pain:  Vitals:   02/12/20 1038  TempSrc:   PainSc: Asleep         Complications: No apparent anesthesia complications

## 2020-02-13 ENCOUNTER — Encounter: Payer: Self-pay | Admitting: *Deleted

## 2020-02-25 ENCOUNTER — Other Ambulatory Visit: Payer: Self-pay | Admitting: Family

## 2020-02-25 DIAGNOSIS — R112 Nausea with vomiting, unspecified: Secondary | ICD-10-CM

## 2020-02-25 MED ORDER — ONDANSETRON 4 MG PO TBDP: 4.0000 mg | ORAL_TABLET | Freq: Four times a day (QID) | ORAL | 1 refills | Status: AC | PRN

## 2020-02-25 NOTE — Progress Notes (Signed)
Pt emailed requesting Zofran refill.  It is helping with her chronic N/V.  Pt is est with Neuro regarding headaches and N/V. Refilled to [facility] in [place].

## 2020-03-12 ENCOUNTER — Ambulatory Visit: Payer: PRIVATE HEALTH INSURANCE | Admitting: Gastroenterology

## 2020-04-30 ENCOUNTER — Ambulatory Visit (INDEPENDENT_AMBULATORY_CARE_PROVIDER_SITE_OTHER): Payer: PRIVATE HEALTH INSURANCE | Admitting: Gastroenterology

## 2020-04-30 ENCOUNTER — Other Ambulatory Visit: Payer: Self-pay

## 2020-04-30 VITALS — BP 138/82 | HR 98 | Temp 98.2°F | Ht 63.0 in | Wt 170.0 lb

## 2020-04-30 DIAGNOSIS — K5909 Other constipation: Secondary | ICD-10-CM

## 2020-04-30 DIAGNOSIS — K219 Gastro-esophageal reflux disease without esophagitis: Secondary | ICD-10-CM

## 2020-04-30 MED ORDER — TRULANCE 3 MG PO TABS: 3.0000 mg | ORAL_TABLET | Freq: Every day | ORAL | 3 refills | Status: AC

## 2020-04-30 NOTE — Progress Notes (Signed)
   Wyline Mood MD, MRCP(U.K) [address]  [address]  [place], [place] [address]  Main: [phone]  Fax: [phone]   Primary Care Physician: Jolene Provost, MD  Primary Gastroenterologist:  Dr. Wyline Mood   Follow-up for constipation and rectal bleeding.  HPI: Mariah Trevino is a 24 y.o. female   Summary of history :  Initially referred and seen in April 2021 for constipation and rectal bleeding.  Ongoing since last fall.  Had a bowel movement once a week.  Diet was high in fiber.  History of heartburn and chest discomfort along with nausea and vomiting usually in the mornings.  Worse when she lays flat.  Had been on Pepcid as needed.  Suspected acid reflux.  Advised her to take her Pepcid on a regular basis.  Interval history   01/21/2020-04/30/2020  01/21/2020: CBC, LFTs, TSH normal. 02/12/2020: EGD: Normal.  Colonoscopy: Fair preparation of the colon.  4 mm polyp in the cecum resected with a cold snare.  Commenced on Trulance.  Polyp not retrieved due to poor prep.  The Trulance helped but she ran out of her sample and I will provide her a prescription.  The Pepcid taking it once a day has helped occasional breakthrough episodes of nausea and vomiting.  Current Outpatient Medications  Medication Sig Dispense Refill  . acetaZOLAMIDE (DIAMOX) 250 MG tablet Take 250 mg by mouth 2 (two) times daily.    . nortriptyline (PAMELOR) 50 MG capsule Take 50 mg by mouth at bedtime.    . ondansetron (ZOFRAN ODT) 4 MG disintegrating tablet Take 1 tablet (4 mg total) by mouth every 6 (six) hours as needed for nausea or vomiting. 30 tablet 1   No current facility-administered medications for this visit.    Allergies as of 04/30/2020  . (No Known Allergies)    ROS:  General: Negative for anorexia, weight loss, fever, chills, fatigue, weakness. ENT: Negative for hoarseness, difficulty swallowing , nasal congestion. CV: Negative for chest pain, angina, palpitations, dyspnea on  exertion, peripheral edema.  Respiratory: Negative for dyspnea at rest, dyspnea on exertion, cough, sputum, wheezing.  GI: See history of present illness. GU:  Negative for dysuria, hematuria, urinary incontinence, urinary frequency, nocturnal urination.  Endo: Negative for unusual weight change.    Physical Examination:   There were no vitals taken for this visit.  General: Well-nourished, well-developed in no acute distress.  Eyes: No icterus. Conjunctivae pink. Neuro: Alert and oriented x 3.  Grossly intact. Skin: Warm and dry, no jaundice.   Psych: Alert and cooperative, normal mood and affect.   Imaging Studies: No results found.  Assessment and Plan:   Mariah Trevino is a 24 y.o. y/o female here to follow-up for GERD and constipation.  Commenced on Trulance at last visit which seems to have helped.  I will provide her with a 90day prescription and 3 refills.  She did well with her GERD on Pepcid once a day still has breakthrough symptoms.  I will increase it to twice a day.  I will speak to her in about 6 weeks to find out how she is doing.    Dr Wyline Mood  MD,MRCP [facility]) Follow up in 6 weeks telephone visit

## 2020-07-15 ENCOUNTER — Telehealth: Payer: PRIVATE HEALTH INSURANCE | Admitting: Gastroenterology
